# Patient Record
Sex: Female | Born: 1995 | Race: White | Hispanic: No | Marital: Single | Smoking: Current every day smoker
Health system: Southern US, Community
[De-identification: ages and names within clinical notes are randomized; demographics above are authoritative.]

## PROBLEM LIST (undated history)

## (undated) DIAGNOSIS — F988 Other specified behavioral and emotional disorders with onset usually occurring in childhood and adolescence: Secondary | ICD-10-CM

## (undated) DIAGNOSIS — R109 Unspecified abdominal pain: Secondary | ICD-10-CM

## (undated) DIAGNOSIS — A3790 Whooping cough, unspecified species without pneumonia: Secondary | ICD-10-CM

## (undated) DIAGNOSIS — G8929 Other chronic pain: Secondary | ICD-10-CM

## (undated) DIAGNOSIS — K760 Fatty (change of) liver, not elsewhere classified: Secondary | ICD-10-CM

---
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
## 2017-08-06 DIAGNOSIS — K588 Other irritable bowel syndrome: Secondary | ICD-10-CM | POA: Insufficient documentation

## 2017-08-06 DIAGNOSIS — F419 Anxiety disorder, unspecified: Secondary | ICD-10-CM | POA: Insufficient documentation

## 2017-08-06 DIAGNOSIS — F3340 Major depressive disorder, recurrent, in remission, unspecified: Secondary | ICD-10-CM | POA: Insufficient documentation

## 2017-09-20 ENCOUNTER — Encounter (HOSPITAL_COMMUNITY): Payer: Self-pay | Admitting: *Deleted

## 2017-09-20 DIAGNOSIS — F17290 Nicotine dependence, other tobacco product, uncomplicated: Secondary | ICD-10-CM | POA: Diagnosis not present

## 2017-09-20 DIAGNOSIS — B9689 Other specified bacterial agents as the cause of diseases classified elsewhere: Secondary | ICD-10-CM | POA: Diagnosis not present

## 2017-09-20 DIAGNOSIS — N760 Acute vaginitis: Secondary | ICD-10-CM | POA: Diagnosis not present

## 2017-09-20 DIAGNOSIS — R1030 Lower abdominal pain, unspecified: Secondary | ICD-10-CM | POA: Diagnosis present

## 2017-09-20 LAB — COMPREHENSIVE METABOLIC PANEL
ALBUMIN: 4.2 g/dL (ref 3.5–5.0)
ALT: 19 U/L (ref 14–54)
AST: 19 U/L (ref 15–41)
Alkaline Phosphatase: 92 U/L (ref 38–126)
Anion gap: 11 (ref 5–15)
BILIRUBIN TOTAL: 0.8 mg/dL (ref 0.3–1.2)
BUN: 10 mg/dL (ref 6–20)
CO2: 21 mmol/L — AB (ref 22–32)
Calcium: 9.2 mg/dL (ref 8.9–10.3)
Chloride: 105 mmol/L (ref 101–111)
Creatinine, Ser: 0.83 mg/dL (ref 0.44–1.00)
GFR calc Af Amer: >60 mL/min (ref >60)
GFR calc non Af Amer: >60 mL/min (ref >60)
GLUCOSE: 100 mg/dL — AB (ref 65–99)
POTASSIUM: 3.8 mmol/L (ref 3.5–5.1)
SODIUM: 137 mmol/L (ref 135–145)
TOTAL PROTEIN: 7.2 g/dL (ref 6.5–8.1)

## 2017-09-20 LAB — URINALYSIS, ROUTINE W REFLEX MICROSCOPIC
BACTERIA UA: NONE SEEN
Bilirubin Urine: NEGATIVE
Glucose, UA: NEGATIVE mg/dL
Hgb urine dipstick: NEGATIVE
Ketones, ur: NEGATIVE mg/dL
Nitrite: NEGATIVE
PROTEIN: NEGATIVE mg/dL
SPECIFIC GRAVITY, URINE: 1.023 (ref 1.005–1.030)
pH: 7 (ref 5.0–8.0)

## 2017-09-20 LAB — CBC
HEMATOCRIT: 39.5 % (ref 36.0–46.0)
Hemoglobin: 13.3 g/dL (ref 12.0–15.0)
MCH: 28.6 pg (ref 26.0–34.0)
MCHC: 33.7 g/dL (ref 30.0–36.0)
MCV: 84.9 fL (ref 78.0–100.0)
Platelets: 254 10*3/uL (ref 150–400)
RBC: 4.65 MIL/uL (ref 3.87–5.11)
RDW: 13.9 % (ref 11.5–15.5)
WBC: 7.9 10*3/uL (ref 4.0–10.5)

## 2017-09-20 LAB — LIPASE, BLOOD: Lipase: 23 U/L (ref 11–51)

## 2017-09-20 NOTE — ED Triage Notes (Signed)
The pt is c/o abd pain since march  She saw her doctor Tuesday and had a ultra sound .  She is scheduled for a gb scan on Tuesday.  Her pain has increased nausea no vomiting  lmp sept 9th

## 2017-09-21 ENCOUNTER — Emergency Department (HOSPITAL_COMMUNITY)
Admission: EM | Admit: 2017-09-21 | Discharge: 2017-09-21 | Disposition: A | Discharge status: Home / Self Care | Payer: Medicaid Other | Attending: Emergency Medicine | Admitting: Emergency Medicine

## 2017-09-21 DIAGNOSIS — N760 Acute vaginitis: Secondary | ICD-10-CM

## 2017-09-21 DIAGNOSIS — B9689 Other specified bacterial agents as the cause of diseases classified elsewhere: Secondary | ICD-10-CM

## 2017-09-21 HISTORY — DX: Fatty (change of) liver, not elsewhere classified: K76.0

## 2017-09-21 LAB — WET PREP, GENITAL
SPERM: NONE SEEN
TRICH WET PREP: NONE SEEN
YEAST WET PREP: NONE SEEN

## 2017-09-21 LAB — GC/CHLAMYDIA PROBE AMP (~~LOC~~) NOT AT ARMC
Chlamydia: NEGATIVE
Neisseria Gonorrhea: NEGATIVE

## 2017-09-21 LAB — POC URINE PREG, ED: Preg Test, Ur: NEGATIVE

## 2017-09-21 MED ORDER — METRONIDAZOLE 500 MG PO TABS
500.0000 mg | ORAL_TABLET | Freq: Once | ORAL | Status: AC
  Administered 2017-09-21: 500 mg via ORAL
  Filled 2017-09-21: qty 1

## 2017-09-21 MED ORDER — METRONIDAZOLE 500 MG PO TABS: 500.0000 mg | ORAL_TABLET | Freq: Two times a day (BID) | ORAL | 0 refills | Status: DC

## 2017-09-21 MED ORDER — GI COCKTAIL ~~LOC~~
30.0000 mL | Freq: Once | ORAL | Status: AC
  Administered 2017-09-21: 30 mL via ORAL
  Filled 2017-09-21: qty 30

## 2017-09-21 NOTE — ED Notes (Signed)
Signature pad not working, pt verbalized understanding of discharge instructions and prescriptions. 

## 2017-09-21 NOTE — ED Notes (Signed)
Patient ambulatory to bed at this time.  No distress noted

## 2017-09-21 NOTE — ED Provider Notes (Signed)
[facility] EMERGENCY DEPARTMENT Provider Note   CSN: [id number] Arrival date & time: 09/20/17  2005     History   Chief Complaint Chief Complaint  Patient presents with  . Abdominal Pain    HPI Jacqueline Beard is a 21 y.o. female.  The history is provided by the patient.  Abdominal Pain   This is a chronic problem. The current episode started more than 1 week ago. The problem occurs constantly. The problem has not changed since onset.The pain is associated with an unknown factor. The pain is located in the suprapubic region. The pain is moderate. Associated symptoms include nausea. Pertinent negatives include constipation. Nothing aggravates the symptoms. Nothing relieves the symptoms. Past workup does not include GI consult. Her past medical history does not include PUD.    Past Medical History:  Diagnosis Date  . Attention deficit disorder (ADD)   . Liver fatty degeneration   . Pertussis     There are no active problems to display for this patient.   History reviewed. No pertinent surgical history.  OB History    No data available       Home Medications    Prior to Admission medications   Medication Sig Start Date End Date Taking? Authorizing Provider  Soft Lens Products (REWETTING DROPS) SOLN by Does not apply route as directed.   Yes [provider]    Family History No family history on file.  Social History Social History  Substance Use Topics  . Smoking status: Former Games developer  . Smokeless tobacco: Current User  . Alcohol use Not on file     Allergies   Patient has no known allergies.   Review of Systems Review of Systems  Gastrointestinal: Positive for abdominal pain and nausea. Negative for constipation.  All other systems reviewed and are negative.    Physical Exam Updated Vital Signs BP 125/83 (BP Location: Right Arm)   Pulse 94   Temp 99.2 F (37.3 C) (Oral)   Resp 16   Ht 5\' 5"  (1.651 m)   Wt 104.3 kg  (230 lb)   LMP 08/21/2017   SpO2 99%   BMI 38.27 kg/m   Physical Exam  Constitutional: She is oriented to person, place, and time. She appears well-developed and well-nourished. No distress.  HENT:  Head: Normocephalic and atraumatic.  Mouth/Throat: No oropharyngeal exudate.  Eyes: Pupils are equal, round, and reactive to light. Conjunctivae are normal.  Neck: Normal range of motion. Neck supple.  Cardiovascular: Normal rate, regular rhythm, normal heart sounds and intact distal pulses.   Pulmonary/Chest: Effort normal and breath sounds normal. She has no wheezes.  Abdominal: Soft. Bowel sounds are normal. She exhibits no mass. There is no tenderness. There is no rebound and no guarding.  Genitourinary:  Genitourinary Comments: Scant white discharge no CMT or adnexal tenderness   Musculoskeletal: Normal range of motion.  Neurological: She is alert and oriented to person, place, and time.  Skin: Skin is warm and dry. Capillary refill takes less than 2 seconds.  Psychiatric: She has a normal mood and affect.     ED Treatments / Results   Vitals:   09/21/17 0116 09/21/17 0550  BP: 125/83 (!) 118/92  Pulse: 94 77  Resp: 16 16  Temp:    SpO2: 99% 100%    Labs (all labs ordered are listed, but only abnormal results are displayed)  Results for orders placed or performed during the hospital encounter of 09/21/17  Wet prep,  genital  Result Value Ref Range   Yeast Wet Prep HPF POC NONE SEEN NONE SEEN   Trich, Wet Prep NONE SEEN NONE SEEN   Clue Cells Wet Prep HPF POC PRESENT (A) NONE SEEN   WBC, Wet Prep HPF POC MANY (A) NONE SEEN   Sperm NONE SEEN   Lipase, blood  Result Value Ref Range   Lipase 23 11 - 51 U/L  Comprehensive metabolic panel  Result Value Ref Range   Sodium 137 135 - 145 mmol/L   Potassium 3.8 3.5 - 5.1 mmol/L   Chloride 105 101 - 111 mmol/L   CO2 21 (L) 22 - 32 mmol/L   Glucose, Bld 100 (H) 65 - 99 mg/dL   BUN 10 6 - 20 mg/dL   Creatinine, Ser 1.47  0.44 - 1.00 mg/dL   Calcium 9.2 8.9 - 82.9 mg/dL   Total Protein 7.2 6.5 - 8.1 g/dL   Albumin 4.2 3.5 - 5.0 g/dL   AST 19 15 - 41 U/L   ALT 19 14 - 54 U/L   Alkaline Phosphatase 92 38 - 126 U/L   Total Bilirubin 0.8 0.3 - 1.2 mg/dL   GFR calc non Af Amer >60 >60 mL/min   GFR calc Af Amer >60 >60 mL/min   Anion gap 11 5 - 15  CBC  Result Value Ref Range   WBC 7.9 4.0 - 10.5 K/uL   RBC 4.65 3.87 - 5.11 MIL/uL   Hemoglobin 13.3 12.0 - 15.0 g/dL   HCT 56.2 13.0 - 86.5 %   MCV 84.9 78.0 - 100.0 fL   MCH 28.6 26.0 - 34.0 pg   MCHC 33.7 30.0 - 36.0 g/dL   RDW 78.4 69.6 - 29.5 %   Platelets 254 150 - 400 K/uL  Urinalysis, Routine w reflex microscopic  Result Value Ref Range   Color, Urine YELLOW YELLOW   APPearance CLEAR CLEAR   Specific Gravity, Urine 1.023 1.005 - 1.030   pH 7.0 5.0 - 8.0   Glucose, UA NEGATIVE NEGATIVE mg/dL   Hgb urine dipstick NEGATIVE NEGATIVE   Bilirubin Urine NEGATIVE NEGATIVE   Ketones, ur NEGATIVE NEGATIVE mg/dL   Protein, ur NEGATIVE NEGATIVE mg/dL   Nitrite NEGATIVE NEGATIVE   Leukocytes, UA TRACE (A) NEGATIVE   RBC / HPF 0-5 0 - 5 RBC/hpf   WBC, UA 0-5 0 - 5 WBC/hpf   Bacteria, UA NONE SEEN NONE SEEN   Squamous Epithelial / LPF 0-5 (A) NONE SEEN   Mucus PRESENT   POC urine preg, ED  Result Value Ref Range   Preg Test, Ur NEGATIVE NEGATIVE   No results found.   Procedures Procedures (including critical care time)  Medications Ordered in ED Medications  gi cocktail (Maalox,Lidocaine,Donnatal) (not administered)     Final Clinical Impressions(s) / ED Diagnoses   Strict return precautions given.  Strict return precautions given for  Shortness of breath, swelling or the lips or tongue, chest pain, dyspnea on exertion, new weakness or numbness changes in vision or speech,  Inability to tolerate liquids or food, changes in voice cough, altered mental status or any concerns. No signs of systemic illness or infection. The patient is  nontoxic-appearing on exam and vital signs are within normal limits.    I have reviewed the triage vital signs and the nursing notes. Pertinent labs &imaging results that were available during my care of the patient were reviewed by me and considered in my medical decision making (see chart for details).  After history, exam, and medical workup I feel the patient has been appropriately medically screened and is safe for discharge home. Pertinent diagnoses were discussed with the patient. Patient was given return precautions.    Eddie Payette, MD 09/21/17 [phone]

## 2017-09-26 ENCOUNTER — Encounter (HOSPITAL_BASED_OUTPATIENT_CLINIC_OR_DEPARTMENT_OTHER): Payer: Self-pay | Admitting: Emergency Medicine

## 2017-09-26 ENCOUNTER — Emergency Department (HOSPITAL_BASED_OUTPATIENT_CLINIC_OR_DEPARTMENT_OTHER)
Admission: EM | Admit: 2017-09-26 | Discharge: 2017-09-26 | Disposition: A | Discharge status: Home / Self Care | Payer: Medicaid Other | Attending: Emergency Medicine | Admitting: Emergency Medicine

## 2017-09-26 ENCOUNTER — Emergency Department (HOSPITAL_BASED_OUTPATIENT_CLINIC_OR_DEPARTMENT_OTHER): Payer: Medicaid Other

## 2017-09-26 DIAGNOSIS — Y998 Other external cause status: Secondary | ICD-10-CM | POA: Diagnosis not present

## 2017-09-26 DIAGNOSIS — Z87891 Personal history of nicotine dependence: Secondary | ICD-10-CM | POA: Diagnosis not present

## 2017-09-26 DIAGNOSIS — Y92480 Sidewalk as the place of occurrence of the external cause: Secondary | ICD-10-CM | POA: Diagnosis not present

## 2017-09-26 DIAGNOSIS — S93402A Sprain of unspecified ligament of left ankle, initial encounter: Secondary | ICD-10-CM | POA: Diagnosis not present

## 2017-09-26 DIAGNOSIS — Y9301 Activity, walking, marching and hiking: Secondary | ICD-10-CM | POA: Diagnosis not present

## 2017-09-26 DIAGNOSIS — Z23 Encounter for immunization: Secondary | ICD-10-CM | POA: Diagnosis not present

## 2017-09-26 DIAGNOSIS — W19XXXA Unspecified fall, initial encounter: Secondary | ICD-10-CM

## 2017-09-26 DIAGNOSIS — W010XXA Fall on same level from slipping, tripping and stumbling without subsequent striking against object, initial encounter: Secondary | ICD-10-CM | POA: Insufficient documentation

## 2017-09-26 DIAGNOSIS — S99912A Unspecified injury of left ankle, initial encounter: Secondary | ICD-10-CM | POA: Diagnosis present

## 2017-09-26 HISTORY — DX: Unspecified abdominal pain: R10.9

## 2017-09-26 HISTORY — DX: Other chronic pain: G89.29

## 2017-09-26 MED ORDER — NAPROXEN 250 MG PO TABS: 250.0000 mg | ORAL_TABLET | Freq: Two times a day (BID) | ORAL | 0 refills | Status: DC

## 2017-09-26 MED ORDER — IBUPROFEN 800 MG PO TABS
800.0000 mg | ORAL_TABLET | Freq: Once | ORAL | Status: AC
  Administered 2017-09-26: 800 mg via ORAL
  Filled 2017-09-26: qty 1

## 2017-09-26 MED ORDER — TETANUS-DIPHTH-ACELL PERTUSSIS 5-2.5-18.5 LF-MCG/0.5 IM SUSP
0.5000 mL | Freq: Once | INTRAMUSCULAR | Status: AC
  Administered 2017-09-26: 0.5 mL via INTRAMUSCULAR
  Filled 2017-09-26: qty 0.5

## 2017-09-26 NOTE — ED Provider Notes (Signed)
[facility] EMERGENCY DEPARTMENT Provider Note   CSN: [id number] Arrival date & time: 09/26/17  2107     History   Chief Complaint Chief Complaint  Patient presents with  . Fall    HPI Jacqueline Beard is a 21 y.o. female.  Jacqueline Beard is a 21 y.o. Female who presents to the emergency department complaining of left ankle pain after a trip and fall prior to arrival.  Patient reports she was walking when she did not see an elevated curb and tripped causing her to twist her left ankle.  She also reports a superficial abrasion to her right knee. She denies other injury. She denies hitting her head or LOC. She reports pain worse to her left lateral ankle. She has taken nothing for treatment of her symptoms today.  She denies fevers, numbness, tingling, weakness, knee pain.  She is unsure when her last tetanus shot was.   The history is provided by the patient and medical records. No language interpreter was used.  Fall     Past Medical History:  Diagnosis Date  . Attention deficit disorder (ADD)   . Chronic abdominal pain   . Liver fatty degeneration   . Pertussis     There are no active problems to display for this patient.   History reviewed. No pertinent surgical history.  OB History    No data available       Home Medications    Prior to Admission medications   Medication Sig Start Date End Date Taking? Authorizing Provider  metroNIDAZOLE (FLAGYL) 500 MG tablet Take 1 tablet (500 mg total) by mouth 2 (two) times daily. One po bid x 7 days 09/21/17   Palumbo, April, MD  naproxen (NAPROSYN) 250 MG tablet Take 1 tablet (250 mg total) by mouth 2 (two) times daily with a meal. 09/26/17   Everlene Farrieransie, Zaidee Rion, PA-C  Soft Lens Products (REWETTING DROPS) SOLN by Does not apply route as directed.    [provider]    Family History History reviewed. No pertinent family history.  Social History Social History  Substance Use Topics  . Smoking status: Former  Smoker    Types: E-cigarettes  . Smokeless tobacco: Current User  . Alcohol use No     Allergies   Patient has no known allergies.   Review of Systems Review of Systems  Constitutional: Negative for fever.  Musculoskeletal: Positive for arthralgias and joint swelling.  Skin: Positive for wound. Negative for color change.  Neurological: Negative for weakness and numbness.     Physical Exam Updated Vital Signs BP (!) 140/96 (BP Location: Right Arm)   Pulse (!) 104   Temp 98.1 F (36.7 C) (Oral)   Resp 18   Ht 5\' 5"  (1.651 m)   Wt 104.3 kg (230 lb)   LMP 08/08/2017   SpO2 100%   BMI 38.27 kg/m   Physical Exam  Constitutional: She appears well-developed and well-nourished. No distress.  HENT:  Head: Normocephalic and atraumatic.  Eyes: Right eye exhibits no discharge. Left eye exhibits no discharge.  Cardiovascular: Normal rate, regular rhythm and intact distal pulses.   Bilateral dorsalis pedis and posterior tibialis pulses are intact.  Pulmonary/Chest: Effort normal. No respiratory distress.  Musculoskeletal: She exhibits edema and tenderness. She exhibits no deformity.  Tenderness and mild edema noted to the lateral aspect of her left ankle.  No deformity noted.  No ankle instability noted.  No tenderness noted to her left knee or foot.  Superficial  abrasion noted to her right knee.  Neurological: She is alert. No sensory deficit. Coordination normal.  Skin: Skin is warm and dry. Capillary refill takes less than 2 seconds. No rash noted. She is not diaphoretic. No erythema. No pallor.  Psychiatric: She has a normal mood and affect. Her behavior is normal.  Nursing note and vitals reviewed.    ED Treatments / Results  Labs (all labs ordered are listed, but only abnormal results are displayed) Labs Reviewed - No data to display  EKG  EKG Interpretation None       Radiology Dg Ankle Complete Left  Result Date: 09/26/2017 CLINICAL DATA:  Left ankle pain  laterally after twisting injury tonight. EXAM: LEFT ANKLE COMPLETE - 3+ VIEW COMPARISON:  12/28/2010 FINDINGS: Mild soft tissue swelling overlying the lateral malleolus without visible underlying fracture. Plafond and talar dome intact. No malalignment at the Lisfranc joint. No definite tibiotalar joint effusion or well-defined injury of the anterior process of the calcaneus. IMPRESSION: 1. Soft tissue swelling overlying the lateral malleolus but without underlying acute bony abnormality. Electronically Signed   By: Gaylyn Rong M.D.   On: 09/26/2017 21:57    Procedures Procedures (including critical care time)  Medications Ordered in ED Medications  ibuprofen (ADVIL,MOTRIN) tablet 800 mg (800 mg Oral Given 09/26/17 2143)  Tdap (BOOSTRIX) injection 0.5 mL (0.5 mLs Intramuscular Given 09/26/17 2144)     Initial Impression / Assessment and Plan / ED Course  I have reviewed the triage vital signs and the nursing notes.  Pertinent labs & imaging results that were available during my care of the patient were reviewed by me and considered in my medical decision making (see chart for details).    This is a 21 y.o. Female who presents to the emergency department complaining of left ankle pain after a trip and fall prior to arrival.  Patient reports she was walking when she did not see an elevated curb and tripped causing her to twist her left ankle. She is unsure when her last tetanus shot was. On exam the patient is afebrile nontoxic-appearing.  She has mild tenderness and edema noted to the lateral aspect of her left ankle.  No ankle laxity noted.  She is neurovascularly intact.  Ray shows soft tissue swelling overlying the lateral malleolus, but without underlying acute bony abnormality.  Suspect ankle sprain.  Will provide patient with an ASO ankle brace and crutches and have her non-weight-bear until her pain is improving.  I advised if her pain persists she should follow-up with sports  medicine physician Dr. Pearletha Forge.  Return precautions discussed.  Naproxen, ice and elevation for pain control. I advised the patient to follow-up with their primary care provider this week. I advised the patient to return to the emergency department with new or worsening symptoms or new concerns. The patient verbalized understanding and agreement with plan.     Final Clinical Impressions(s) / ED Diagnoses   Final diagnoses:  Sprain of left ankle, unspecified ligament, initial encounter  Fall, initial encounter  Need for Tdap vaccination    New Prescriptions New Prescriptions   NAPROXEN (NAPROSYN) 250 MG TABLET    Take 1 tablet (250 mg total) by mouth 2 (two) times daily with a meal.     Everlene Farrier, PA-C 09/26/17 2221    Pricilla Loveless, MD 09/26/17 2314

## 2017-09-26 NOTE — ED Triage Notes (Signed)
Patient states that she fell earlier today and hurt her left ankle. Swelling noted to her left ankle

## 2017-10-04 ENCOUNTER — Ambulatory Visit (INDEPENDENT_AMBULATORY_CARE_PROVIDER_SITE_OTHER): Payer: Medicaid Other | Admitting: Family Medicine

## 2017-10-04 ENCOUNTER — Encounter: Payer: Self-pay | Admitting: Family Medicine

## 2017-10-04 DIAGNOSIS — S99912A Unspecified injury of left ankle, initial encounter: Secondary | ICD-10-CM | POA: Diagnosis not present

## 2017-10-04 NOTE — Patient Instructions (Addendum)
You have an ankle sprain. Ice the area for 15 minutes at a time, 3-4 times a day Aleve 2 tabs twice a day with food OR ibuprofen 3 tabs three times a day with food for pain and inflammation as needed. Elevate above the level of your heart when possible Use boot when up and walking around to help with stability while you recover from this injury. Come out of the boot twice a day to do Up/down and alphabet exercises 2-3 sets of each. Start theraband strengthening exercises when directed - once a day 3 sets of 10 - in about 1 week. Consider physical therapy for strengthening and balance exercises. If not improving as expected, we may repeat x-rays or consider further testing like an MRI. Follow up in 2 weeks for reevaluation.

## 2017-10-05 ENCOUNTER — Encounter: Payer: Self-pay | Admitting: Family Medicine

## 2017-10-05 DIAGNOSIS — F909 Attention-deficit hyperactivity disorder, unspecified type: Secondary | ICD-10-CM | POA: Insufficient documentation

## 2017-10-05 DIAGNOSIS — S99912D Unspecified injury of left ankle, subsequent encounter: Secondary | ICD-10-CM | POA: Insufficient documentation

## 2017-10-05 NOTE — Assessment & Plan Note (Signed)
independently reviewed radiographs and no evidence fracture.  Consistent with severe lateral ankle sprain.  Switch to cam walker due to her instability.  Motion exercises reviewed.  Aleve or ibuprofen.  Elevation.  Shown theraband strengthening exercises.  F/u in 2 weeks for reevaluation.

## 2017-10-05 NOTE — Progress Notes (Signed)
PCP: System, Pcp Not In  Subjective:   HPI: Patient is a 21 y.o. female here for left ankle injury.  Patient reports on 10/31 she tripped and inverted her left ankle. She was unable to bear weight following this. Pain level 5-6/10 and sharp still, lateral. Wearing ASO but feels uncomfortable and feels unstable in this. No prior left ankle injuries. Not taking any medicines for this but is icing. Bruising and swelling but no numbness, no other skin changes.  Past Medical History:  Diagnosis Date  . Attention deficit disorder (ADD)   . Chronic abdominal pain   . Liver fatty degeneration   . Pertussis     Current Outpatient Medications on File Prior to Visit  Medication Sig Dispense Refill  . norethindrone-ethinyl estradiol (JUNEL FE,GILDESS FE,LOESTRIN FE) 1-20 MG-MCG tablet Take by mouth.    . naproxen (NAPROSYN) 250 MG tablet Take 1 tablet (250 mg total) by mouth 2 (two) times daily with a meal. 30 tablet 0  . Soft Lens Products (REWETTING DROPS) SOLN by Does not apply route as directed.     No current facility-administered medications on file prior to visit.     History reviewed. No pertinent surgical history.  No Known Allergies  Social History   Socioeconomic History  . Marital status: Single    Spouse name: Not on file  . Number of children: Not on file  . Years of education: Not on file  . Highest education level: Not on file  Social Needs  . Financial resource strain: Not on file  . Food insecurity - worry: Not on file  . Food insecurity - inability: Not on file  . Transportation needs - medical: Not on file  . Transportation needs - non-medical: Not on file  Occupational History  . Not on file  Tobacco Use  . Smoking status: Former Smoker    Types: E-cigarettes  . Smokeless tobacco: Current User  Substance and Sexual Activity  . Alcohol use: No  . Drug use: No  . Sexual activity: Not on file  Other Topics Concern  . Not on file  Social History  Narrative  . Not on file    History reviewed. No pertinent family history.  BP 118/80   Pulse 82   Ht 5\' 5"  (1.651 m)   Wt 230 lb (104.3 kg)   BMI 38.27 kg/m   Review of Systems: See HPI above.     Objective:  Physical Exam:  Gen: NAD, comfortable in exam room  Left ankle: Mod lateral swelling, mild bruising.  No other deformity. Mod limitation motion all directions but able to do so with 5/5 strength. TTP over ATFL.  No other tenderness including malleoli, base 5th, navicular, fibular head. 2+ ant drawer and talar tilt.   Negative syndesmotic compression. Thompsons test negative. NV intact distally.  Right ankle: No gross deformity, swelling, ecchymoses FROM with full strength. No TTP Negative ant drawer and talar tilt.   Negative syndesmotic compression. Thompsons test negative. NV intact distally.   Assessment & Plan:  1. Left ankle injury - independently reviewed radiographs and no evidence fracture.  Consistent with severe lateral ankle sprain.  Switch to cam walker due to her instability.  Motion exercises reviewed.  Aleve or ibuprofen.  Elevation.  Shown theraband strengthening exercises.  F/u in 2 weeks for reevaluation.

## 2017-10-11 ENCOUNTER — Telehealth: Payer: Self-pay | Admitting: Family Medicine

## 2017-10-11 NOTE — Telephone Encounter (Signed)
This is typical for a severe ankle sprain.  Is she asking to be excused for today or until she sees me back for follow-up?

## 2017-10-11 NOTE — Telephone Encounter (Signed)
Patient calling with concerns of her left ankle. 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   Patient is wanting to know if she can get a note to be excused from work.  Patient has work today at [employer]

## 2017-10-11 NOTE — Telephone Encounter (Signed)
Letter printed to be out until follow-up next week.

## 2017-10-11 NOTE — Telephone Encounter (Signed)
Patient was informed and will pick up letter today

## 2017-10-11 NOTE — Telephone Encounter (Signed)
Patient was asked and she did not specify. She said whatever time was recommended.

## 2017-10-17 ENCOUNTER — Encounter: Payer: Self-pay | Admitting: Family Medicine

## 2017-10-17 ENCOUNTER — Ambulatory Visit (INDEPENDENT_AMBULATORY_CARE_PROVIDER_SITE_OTHER): Payer: Medicaid Other | Admitting: Family Medicine

## 2017-10-17 DIAGNOSIS — S99912D Unspecified injury of left ankle, subsequent encounter: Secondary | ICD-10-CM | POA: Diagnosis not present

## 2017-10-17 NOTE — Patient Instructions (Signed)
You have an ankle sprain. Ice the area for 15 minutes at a time, 3-4 times a day Aleve 2 tabs twice a day with food OR ibuprofen 3 tabs three times a day with food for pain and inflammation as needed. Elevate above the level of your heart when possible Use laceup brace for stability when up and walking around. Start theraband strengthening exercises once a day 3 sets of 10. Do balance exercises as well if tolerated. Consider physical therapy for strengthening and balance exercises - call me if you want to do this. Follow up in 4 weeks for reevaluation.

## 2017-10-18 ENCOUNTER — Encounter: Payer: Self-pay | Admitting: Family Medicine

## 2017-10-18 NOTE — Progress Notes (Signed)
PCP: System, Pcp Not In  Subjective:   HPI: Patient is a 21 y.o. female here for left ankle injury.  11/8: Patient reports on 10/31 she tripped and inverted her left ankle. She was unable to bear weight following this. Pain level 5-6/10 and sharp still, lateral. Wearing ASO but feels uncomfortable and feels unstable in this. No prior left ankle injuries. Not taking any medicines for this but is icing. Bruising and swelling but no numbness, no other skin changes.  11/21: Patient reports she's still dealing with 4/10 level pain lateral left ankle. Associated swelling. Has transitioned from boot to a brace now. Pain worse and sharp by end of work day. Icing sometimes. No other skin changes, numbness.  Past Medical History:  Diagnosis Date  . Attention deficit disorder (ADD)   . Chronic abdominal pain   . Liver fatty degeneration   . Pertussis     Current Outpatient Medications on File Prior to Visit  Medication Sig Dispense Refill  . naproxen (NAPROSYN) 250 MG tablet Take 1 tablet (250 mg total) by mouth 2 (two) times daily with a meal. 30 tablet 0  . norethindrone-ethinyl estradiol (JUNEL FE,GILDESS FE,LOESTRIN FE) 1-20 MG-MCG tablet Take by mouth.    . Soft Lens Products (REWETTING DROPS) SOLN by Does not apply route as directed.     No current facility-administered medications on file prior to visit.     History reviewed. No pertinent surgical history.  No Known Allergies  Social History   Socioeconomic History  . Marital status: Single    Spouse name: Not on file  . Number of children: Not on file  . Years of education: Not on file  . Highest education level: Not on file  Social Needs  . Financial resource strain: Not on file  . Food insecurity - worry: Not on file  . Food insecurity - inability: Not on file  . Transportation needs - medical: Not on file  . Transportation needs - non-medical: Not on file  Occupational History  . Not on file  Tobacco Use   . Smoking status: Former Smoker    Types: E-cigarettes  . Smokeless tobacco: Current User  Substance and Sexual Activity  . Alcohol use: No  . Drug use: No  . Sexual activity: Not on file  Other Topics Concern  . Not on file  Social History Narrative  . Not on file    History reviewed. No pertinent family history.  BP 118/81   Pulse 92   Ht 5\' 5"  (1.651 m)   Wt 230 lb (104.3 kg)   BMI 38.27 kg/m   Review of Systems: See HPI above.     Objective:  Physical Exam:  Gen: NAD, comfortable in exam room.  Left ankle: Mild anterolateral swelling.  No bruising or other deformity. Mild limitation all motions but 5/5 strength. TTP over ATFL.  No other tenderness. 2+ ant drawer and talar tilt.   Negative syndesmotic compression. Thompsons test negative. NV intact distally.  Assessment & Plan:  1. Left ankle injury - 2/2 severe lateral ankle sprain.  Continue ASO.  Add strengthening exercises and balance exercises.  Icing, aleve or ibuprofen.  Elevation.  Consider physical therapy.  F/u in 4 weeks.

## 2017-10-18 NOTE — Assessment & Plan Note (Signed)
2/2 severe lateral ankle sprain.  Continue ASO.  Add strengthening exercises and balance exercises.  Icing, aleve or ibuprofen.  Elevation.  Consider physical therapy.  F/u in 4 weeks.

## 2017-11-14 ENCOUNTER — Encounter: Payer: Self-pay | Admitting: Family Medicine

## 2017-11-14 ENCOUNTER — Ambulatory Visit (INDEPENDENT_AMBULATORY_CARE_PROVIDER_SITE_OTHER): Payer: Medicaid Other | Admitting: Family Medicine

## 2017-11-14 DIAGNOSIS — S99912D Unspecified injury of left ankle, subsequent encounter: Secondary | ICD-10-CM

## 2017-11-14 NOTE — Progress Notes (Signed)
PCP: System, Pcp Not In  Subjective:   HPI: Patient is a 21 y.o. female here for left ankle injury.  11/8: Patient reports on 10/31 she tripped and inverted her left ankle. She was unable to bear weight following this. Pain level 5-6/10 and sharp still, lateral. Wearing ASO but feels uncomfortable and feels unstable in this. No prior left ankle injuries. Not taking any medicines for this but is icing. Bruising and swelling but no numbness, no other skin changes.  11/21: Patient reports she's still dealing with 4/10 level pain lateral left ankle. Associated swelling. Has transitioned from boot to a brace now. Pain worse and sharp by end of work day. Icing sometimes. No other skin changes, numbness.  12/19: Patient reports she continues to struggle with lateral left ankle pain. Currently 2/10 level, throbbing especially with walking and the other night. Staying swollen. Not icing or taking any medicines. Doing home exercises. Hasn't worn brace in a while. No skin changes, numbness.  Past Medical History:  Diagnosis Date  . Attention deficit disorder (ADD)   . Chronic abdominal pain   . Liver fatty degeneration   . Pertussis     Current Outpatient Medications on File Prior to Visit  Medication Sig Dispense Refill  . naproxen (NAPROSYN) 250 MG tablet Take 1 tablet (250 mg total) by mouth 2 (two) times daily with a meal. 30 tablet 0  . norethindrone-ethinyl estradiol (JUNEL FE,GILDESS FE,LOESTRIN FE) 1-20 MG-MCG tablet Take by mouth.    . Soft Lens Products (REWETTING DROPS) SOLN by Does not apply route as directed.     No current facility-administered medications on file prior to visit.     History reviewed. No pertinent surgical history.  No Known Allergies  Social History   Socioeconomic History  . Marital status: Single    Spouse name: Not on file  . Number of children: Not on file  . Years of education: Not on file  . Highest education level: Not on file   Social Needs  . Financial resource strain: Not on file  . Food insecurity - worry: Not on file  . Food insecurity - inability: Not on file  . Transportation needs - medical: Not on file  . Transportation needs - non-medical: Not on file  Occupational History  . Not on file  Tobacco Use  . Smoking status: Former Smoker    Types: E-cigarettes  . Smokeless tobacco: Current User  Substance and Sexual Activity  . Alcohol use: No  . Drug use: No  . Sexual activity: Not on file  Other Topics Concern  . Not on file  Social History Narrative  . Not on file    History reviewed. No pertinent family history.  BP 122/86   Pulse 76   Ht 5\' 5"  (1.651 m)   Wt 230 lb (104.3 kg)   BMI 38.27 kg/m   Review of Systems: See HPI above.     Objective:  Physical Exam:  Gen: NAD, comfortable in exam room.  Left ankle: Mild lateral swelling.  No bruising, other deformity. FROM with 5/5 strength. TTP over ATFL, anterior ankle joint.  No other tenderness. 2+ ant drawer and talar tilt.   Negative syndesmotic compression. Thompsons test negative. NV intact distally.  Assessment & Plan:  1. Left ankle injury - not improving as expected from lateral sprain though still with instability.  Will go ahead with MRI to assess for osteochondral defect of talus.  In meantime, ASO, strengthening exercises.  Icing, ibuprofen  if needed.

## 2017-11-14 NOTE — Patient Instructions (Signed)
We will go ahead with an MRI of your ankle to assess for an osteochondral defect since you're 6 weeks out and not improving as expected. Do strengthening exercises with theraband in meantime. I would also recommend wearing the laceup ankle brace when you're up and walking around since these ligaments are loose. Follow up will depend on the MRI.

## 2017-11-14 NOTE — Assessment & Plan Note (Signed)
not improving as expected from lateral sprain though still with instability.  Will go ahead with MRI to assess for osteochondral defect of talus.  In meantime, ASO, strengthening exercises.  Icing, ibuprofen if needed.

## 2017-12-14 NOTE — Progress Notes (Deleted)
Psychiatric Initial Adult Assessment   Patient Identification: Jacqueline Beard MRN:  [id number] Date of Evaluation:  12/14/2017 Referral Source: Janace LittenKaria Smith, MD Chief Complaint:   Visit Diagnosis: No diagnosis found.  History of Present Illness:   Jacqueline Beard is a 22 y.o. year old female with a history of anxiety, r/o ADD, migraine, prediabetes, fatty liver, hepatomegaly, who is referred for bipolar I disorder.   [facility] Patient was started on Fiorcet for migraine. No detailed information regarding her mood symptoms.    Associated Signs/Symptoms: Depression Symptoms:  {DEPRESSION SYMPTOMS:20000} (Hypo) Manic Symptoms:  {BHH MANIC SYMPTOMS:22872} Anxiety Symptoms:  {BHH ANXIETY SYMPTOMS:22873} Psychotic Symptoms:  {BHH PSYCHOTIC SYMPTOMS:22874} PTSD Symptoms: {BHH PTSD SYMPTOMS:22875}  Past Psychiatric History:  Outpatient:  Psychiatry admission:  Previous suicide attempt:  Past trials of medication:  History of violence:   Previous Psychotropic Medications: {YES/NO:21197}  Substance Abuse History in the last 12 months:  {yes no:314532}  Consequences of Substance Abuse: {BHH CONSEQUENCES OF SUBSTANCE ABUSE:22880}  Past Medical History:  Past Medical History:  Diagnosis Date  . Attention deficit disorder (ADD)   . Chronic abdominal pain   . Liver fatty degeneration   . Pertussis    No past surgical history on file.  Family Psychiatric History: ***  Family History: No family history on file.  Social History:   Social History   Socioeconomic History  . Marital status: Single    Spouse name: Not on file  . Number of children: Not on file  . Years of education: Not on file  . Highest education level: Not on file  Social Needs  . Financial resource strain: Not on file  . Food insecurity - worry: Not on file  . Food insecurity - inability: Not on file  . Transportation needs - medical: Not on file  . Transportation needs - non-medical:  Not on file  Occupational History  . Not on file  Tobacco Use  . Smoking status: Former Smoker    Types: E-cigarettes  . Smokeless tobacco: Current User  Substance and Sexual Activity  . Alcohol use: No  . Drug use: No  . Sexual activity: Not on file  Other Topics Concern  . Not on file  Social History Narrative  . Not on file    Additional Social History: ***  Allergies:  No Known Allergies  Metabolic Disorder Labs: No results found for: HGBA1C, MPG No results found for: PROLACTIN No results found for: CHOL, TRIG, HDL, CHOLHDL, VLDL, LDLCALC   Current Medications: Current Outpatient Medications  Medication Sig Dispense Refill  . naproxen (NAPROSYN) 250 MG tablet Take 1 tablet (250 mg total) by mouth 2 (two) times daily with a meal. 30 tablet 0  . norethindrone-ethinyl estradiol (JUNEL FE,GILDESS FE,LOESTRIN FE) 1-20 MG-MCG tablet Take by mouth.    . Soft Lens Products (REWETTING DROPS) SOLN by Does not apply route as directed.     No current facility-administered medications for this visit.     Neurologic: Headache: No Seizure: No Paresthesias:No  Musculoskeletal: Strength & Muscle Tone: within normal limits Gait & Station: normal Patient leans: N/A  Psychiatric Specialty Exam: ROS  There were no vitals taken for this visit.There is no height or weight on file to calculate BMI.  General Appearance: Fairly Groomed  Eye Contact:  Good  Speech:  Clear and Coherent  Volume:  Normal  Mood:  {BHH MOOD:22306}  Affect:  {Affect (PAA):22687}  Thought Process:  Coherent and Goal Directed  Orientation:  Full (Time, Place, and Person)  Thought Content:  Logical  Suicidal Thoughts:  {ST/HT (PAA):22692}  Homicidal Thoughts:  {ST/HT (PAA):22692}  Memory:  Immediate;   Good Recent;   Good Remote;   Good  Judgement:  {Judgement (PAA):22694}  Insight:  {Insight (PAA):22695}  Psychomotor Activity:  Normal  Concentration:  Concentration: Good and Attention Span: Good   Recall:  Good  Fund of Knowledge:Good  Language: Good  Akathisia:  No  Handed:  Right  AIMS (if indicated):  ***  Assets:  Communication Skills Desire for Improvement  ADL's:  Intact  Cognition: WNL  Sleep:  ***   Assessment  Plan  The patient demonstrates the following risk factors for suicide: Chronic risk factors for suicide include: {Chronic Risk Factors for ZOXWRUE:45409811}. Acute risk factors for suicide include: {Acute Risk Factors for BJYNWGN:56213086}. Protective factors for this patient include: {Protective Factors for Suicide VHQI:69629528}. Considering these factors, the overall suicide risk at this point appears to be {Desc; low/moderate/high:110033}. Patient {ACTION; IS/IS UXL:24401027} appropriate for outpatient follow up.   Treatment Plan Summary: Plan as above   Neysa Hotter, MD 1/18/201910:02 AM

## 2017-12-18 ENCOUNTER — Ambulatory Visit (HOSPITAL_COMMUNITY): Payer: Medicaid Other | Admitting: Psychiatry

## 2018-01-17 ENCOUNTER — Emergency Department (HOSPITAL_BASED_OUTPATIENT_CLINIC_OR_DEPARTMENT_OTHER)
Admission: EM | Admit: 2018-01-17 | Discharge: 2018-01-17 | Disposition: A | Discharge status: Home / Self Care | Payer: Medicaid Other | Attending: Emergency Medicine | Admitting: Emergency Medicine

## 2018-01-17 ENCOUNTER — Encounter (HOSPITAL_BASED_OUTPATIENT_CLINIC_OR_DEPARTMENT_OTHER): Payer: Self-pay | Admitting: *Deleted

## 2018-01-17 ENCOUNTER — Other Ambulatory Visit: Payer: Self-pay

## 2018-01-17 DIAGNOSIS — Z79899 Other long term (current) drug therapy: Secondary | ICD-10-CM | POA: Insufficient documentation

## 2018-01-17 DIAGNOSIS — N898 Other specified noninflammatory disorders of vagina: Secondary | ICD-10-CM | POA: Insufficient documentation

## 2018-01-17 DIAGNOSIS — F17290 Nicotine dependence, other tobacco product, uncomplicated: Secondary | ICD-10-CM | POA: Insufficient documentation

## 2018-01-17 LAB — URINALYSIS, ROUTINE W REFLEX MICROSCOPIC
Bilirubin Urine: NEGATIVE
GLUCOSE, UA: NEGATIVE mg/dL
KETONES UR: NEGATIVE mg/dL
LEUKOCYTES UA: NEGATIVE
NITRITE: NEGATIVE
PROTEIN: NEGATIVE mg/dL
Specific Gravity, Urine: 1.01 (ref 1.005–1.030)
pH: 6 (ref 5.0–8.0)

## 2018-01-17 LAB — WET PREP, GENITAL
Clue Cells Wet Prep HPF POC: NONE SEEN
Sperm: NONE SEEN
TRICH WET PREP: NONE SEEN
Yeast Wet Prep HPF POC: NONE SEEN

## 2018-01-17 LAB — URINALYSIS, MICROSCOPIC (REFLEX)

## 2018-01-17 LAB — PREGNANCY, URINE: Preg Test, Ur: NEGATIVE

## 2018-01-17 NOTE — Progress Notes (Signed)
Patient's SPO2 is between 85% and 87%.  Placed patient on 1 liter nasal cannula.  Patient's SPO2 increased and remained at 96%.

## 2018-01-17 NOTE — ED Triage Notes (Signed)
Vaginal discharge yellow in color with odor for a week.

## 2018-01-17 NOTE — ED Notes (Signed)
Pt. Reports she has had lower abd. Pain with discharge for a week or two.  Pt. reoprts she was last treated for BV in August and didn't finish the medication.  Pt. Believes she may have BV again due to the odor and the type of discharge.

## 2018-01-17 NOTE — ED Provider Notes (Signed)
[facility] EMERGENCY DEPARTMENT Provider Note   CSN: [id number] Arrival date & time: 01/17/18  1605     History   Chief Complaint Chief Complaint  Patient presents with  . Vaginal Discharge    HPI Jacqueline Beard is a 22 y.o. female.  Patient is a 22 year old female who presents with vaginal discharge.  She reports a one-week history of yellow vaginal discharge.  There is no abdominal pain.  No urinary symptoms.  No nausea or vomiting.  She had a history of bacterial vaginosis as well as yeast infections in the past.  She is sexually active with one female partner.  No known history of STDs.      Past Medical History:  Diagnosis Date  . Attention deficit disorder (ADD)   . Chronic abdominal pain   . Liver fatty degeneration   . Pertussis     Patient Active Problem List   Diagnosis Date Noted  . ADHD 10/05/2017  . Left ankle injury, subsequent encounter 10/05/2017  . Anxiety 08/06/2017  . Other irritable bowel syndrome 08/06/2017  . Recurrent major depressive disorder, in remission (HCC) 08/06/2017    History reviewed. No pertinent surgical history.  OB History    No data available       Home Medications    Prior to Admission medications   Medication Sig Start Date End Date Taking? Authorizing Provider  naproxen (NAPROSYN) 250 MG tablet Take 1 tablet (250 mg total) by mouth 2 (two) times daily with a meal. 09/26/17   Everlene Farrier, PA-C  norethindrone-ethinyl estradiol (JUNEL FE,GILDESS FE,LOESTRIN FE) 1-20 MG-MCG tablet Take by mouth. 09/09/17   [provider]  Soft Lens Products (REWETTING DROPS) SOLN by Does not apply route as directed.    [provider]    Family History No family history on file.  Social History Social History   Tobacco Use  . Smoking status: Former Smoker    Types: E-cigarettes  . Smokeless tobacco: Current User  Substance Use Topics  . Alcohol use: No  . Drug use: No     Allergies   Patient  has no known allergies.   Review of Systems Review of Systems  Constitutional: Negative for chills, diaphoresis, fatigue and fever.  HENT: Negative for congestion, rhinorrhea and sneezing.   Eyes: Negative.   Respiratory: Negative for cough, chest tightness and shortness of breath.   Cardiovascular: Negative for chest pain and leg swelling.  Gastrointestinal: Negative for abdominal pain, blood in stool, diarrhea, nausea and vomiting.  Genitourinary: Positive for vaginal bleeding. Negative for difficulty urinating, flank pain, frequency, hematuria, vaginal discharge and vaginal pain.  Musculoskeletal: Negative for arthralgias and back pain.  Skin: Negative for rash.  Neurological: Negative for dizziness, speech difficulty, weakness, numbness and headaches.     Physical Exam Updated Vital Signs BP 137/70   Pulse 84   Temp 98.2 F (36.8 C) (Oral)   Resp 17   Ht 5\' 5"  (1.651 m)   Wt 111.1 kg (245 lb)   SpO2 98%   BMI 40.77 kg/m   Physical Exam  Constitutional: She is oriented to person, place, and time. She appears well-developed and well-nourished.  HENT:  Head: Normocephalic and atraumatic.  Eyes: Pupils are equal, round, and reactive to light.  Neck: Normal range of motion. Neck supple.  Cardiovascular: Normal rate, regular rhythm and normal heart sounds.  Pulmonary/Chest: Effort normal and breath sounds normal. No respiratory distress. She has no wheezes. She has no rales. She exhibits no  tenderness.  Abdominal: Soft. Bowel sounds are normal. There is no tenderness. There is no rebound and no guarding.  Genitourinary: Vaginal discharge found.  Genitourinary Comments: Positive thin white discharge, no cervical motion tenderness or adnexal tenderness  Musculoskeletal: Normal range of motion. She exhibits no edema.  Lymphadenopathy:    She has no cervical adenopathy.  Neurological: She is alert and oriented to person, place, and time.  Skin: Skin is warm and dry. No rash  noted.  Psychiatric: She has a normal mood and affect.     ED Treatments / Results  Labs (all labs ordered are listed, but only abnormal results are displayed) Labs Reviewed  WET PREP, GENITAL - Abnormal; Notable for the following components:      Result Value   WBC, Wet Prep HPF POC FEW (*)    All other components within normal limits  URINALYSIS, ROUTINE W REFLEX MICROSCOPIC - Abnormal; Notable for the following components:   Hgb urine dipstick TRACE (*)    All other components within normal limits  URINALYSIS, MICROSCOPIC (REFLEX) - Abnormal; Notable for the following components:   Bacteria, UA FEW (*)    Squamous Epithelial / LPF 0-5 (*)    All other components within normal limits  PREGNANCY, URINE  RPR  HIV ANTIBODY (ROUTINE TESTING)  GC/CHLAMYDIA PROBE AMP ([place]) NOT AT [facility]    EKG  EKG Interpretation None       Radiology No results found.  Procedures Procedures (including critical care time)  Medications Ordered in ED Medications - No data to display   Initial Impression / Assessment and Plan / ED Course  I have reviewed the triage vital signs and the nursing notes.  Pertinent labs & imaging results that were available during my care of the patient were reviewed by me and considered in my medical decision making (see chart for details).     Patient is a 22 year old female who presents with vaginal discharge.  She has a small amount of discharge on exam.  Her wet prep is negative.  Her pregnancy test is negative.  STD testing is pending.  She was offered prophylactic treatment for STDs but she is currently refusing.  She wants to wait for the results before being treated.  She was referred to the women's outpatient clinic to follow-up if her symptoms continue.  Final Clinical Impressions(s) / ED Diagnoses   Final diagnoses:  Vaginal discharge    ED Discharge Orders    None       Rolan BuccoBelfi, Zari Cly, MD 01/17/18 2231

## 2018-01-19 LAB — RPR: RPR Ser Ql: NONREACTIVE

## 2018-01-19 LAB — HIV ANTIBODY (ROUTINE TESTING W REFLEX): HIV Screen 4th Generation wRfx: NONREACTIVE

## 2018-01-21 LAB — GC/CHLAMYDIA PROBE AMP (~~LOC~~) NOT AT ARMC
Chlamydia: NEGATIVE
Neisseria Gonorrhea: NEGATIVE

## 2018-02-13 ENCOUNTER — Other Ambulatory Visit: Payer: Self-pay

## 2018-02-13 ENCOUNTER — Encounter (HOSPITAL_BASED_OUTPATIENT_CLINIC_OR_DEPARTMENT_OTHER): Payer: Self-pay | Admitting: Emergency Medicine

## 2018-02-13 ENCOUNTER — Emergency Department (HOSPITAL_BASED_OUTPATIENT_CLINIC_OR_DEPARTMENT_OTHER)
Admission: EM | Admit: 2018-02-13 | Discharge: 2018-02-13 | Disposition: A | Discharge status: Home / Self Care | Payer: Medicaid Other | Attending: Emergency Medicine | Admitting: Emergency Medicine

## 2018-02-13 DIAGNOSIS — R51 Headache: Secondary | ICD-10-CM | POA: Insufficient documentation

## 2018-02-13 DIAGNOSIS — Z5321 Procedure and treatment not carried out due to patient leaving prior to being seen by health care provider: Secondary | ICD-10-CM | POA: Insufficient documentation

## 2018-02-13 NOTE — ED Notes (Signed)
Registration came back and told this RN that the patient chose to leave

## 2018-02-13 NOTE — ED Triage Notes (Signed)
Patient states that she has had a headache x 3 days  - patient states that the pain is over her right eye.

## 2018-02-15 NOTE — ED Notes (Signed)
02/15/2018, Follow-up call completed. 

## 2018-05-14 ENCOUNTER — Other Ambulatory Visit: Payer: Self-pay

## 2018-05-14 ENCOUNTER — Emergency Department (HOSPITAL_BASED_OUTPATIENT_CLINIC_OR_DEPARTMENT_OTHER)
Admission: EM | Admit: 2018-05-14 | Discharge: 2018-05-14 | Disposition: A | Discharge status: Home / Self Care | Payer: Medicaid Other | Attending: Emergency Medicine | Admitting: Emergency Medicine

## 2018-05-14 ENCOUNTER — Encounter (HOSPITAL_BASED_OUTPATIENT_CLINIC_OR_DEPARTMENT_OTHER): Payer: Self-pay | Admitting: Emergency Medicine

## 2018-05-14 DIAGNOSIS — F17228 Nicotine dependence, chewing tobacco, with other nicotine-induced disorders: Secondary | ICD-10-CM | POA: Insufficient documentation

## 2018-05-14 DIAGNOSIS — L309 Dermatitis, unspecified: Secondary | ICD-10-CM | POA: Insufficient documentation

## 2018-05-14 DIAGNOSIS — Z79899 Other long term (current) drug therapy: Secondary | ICD-10-CM | POA: Insufficient documentation

## 2018-05-14 NOTE — ED Triage Notes (Signed)
Patient states that she has a rash between her thighs where they rub together

## 2018-05-14 NOTE — ED Provider Notes (Signed)
[facility] EMERGENCY DEPARTMENT Provider Note   CSN: [id number] Arrival date & time: 05/14/18  [id number]     History   Chief Complaint Chief Complaint  Patient presents with  . Rash    HPI Jacqueline Beard is a 22 y.o. female.  HPI Patient is a 22 year old female presents the emergency department with complaints of ongoing irritation of her bilateral proximal medial thighs from skin irritation from her new work [employer].  She states she wears scrubs at work and the crotch of the scrubs does not come all the way up resulting in skin on skin irritation.  She is looking for recommendations as to what can help with her discomfort.  No fevers or chills.  No other complaints.   Past Medical History:  Diagnosis Date  . Attention deficit disorder (ADD)   . Chronic abdominal pain   . Liver fatty degeneration   . Pertussis     Patient Active Problem List   Diagnosis Date Noted  . ADHD 10/05/2017  . Left ankle injury, subsequent encounter 10/05/2017  . Anxiety 08/06/2017  . Other irritable bowel syndrome 08/06/2017  . Recurrent major depressive disorder, in remission (HCC) 08/06/2017    History reviewed. No pertinent surgical history.   OB History   None      Home Medications    Prior to Admission medications   Medication Sig Start Date End Date Taking? Authorizing Provider  naproxen (NAPROSYN) 250 MG tablet Take 1 tablet (250 mg total) by mouth 2 (two) times daily with a meal. 09/26/17   Everlene Farrieransie, William, PA-C  norethindrone-ethinyl estradiol (JUNEL FE,GILDESS FE,LOESTRIN FE) 1-20 MG-MCG tablet Take by mouth. 09/09/17   [provider]  Soft Lens Products (REWETTING DROPS) SOLN by Does not apply route as directed.    [provider]    Family History History reviewed. No pertinent family history.  Social History Social History   Tobacco Use  . Smoking status: Former Smoker    Types: E-cigarettes  . Smokeless tobacco: Current User  Substance Use  Topics  . Alcohol use: No  . Drug use: No     Allergies   Patient has no known allergies.   Review of Systems Review of Systems  All other systems reviewed and are negative.    Physical Exam Updated Vital Signs BP (!) 143/91 (BP Location: Left Arm)   Pulse 91   Temp 98.1 F (36.7 C) (Oral)   Resp 18   Ht 5\' 5"  (1.651 m)   Wt 114.3 kg (252 lb)   SpO2 98%   BMI 41.93 kg/m   Physical Exam  Constitutional: She is oriented to person, place, and time. She appears well-developed and well-nourished.  HENT:  Head: Normocephalic.  Eyes: EOM are normal.  Neck: Normal range of motion.  Pulmonary/Chest: Effort normal.  Abdominal: She exhibits no distension.  Musculoskeletal: Normal range of motion.  Chronic appearing irritation of her bilateral proximal medial thighs without surrounding erythema or warmth  Neurological: She is alert and oriented to person, place, and time.  Psychiatric: She has a normal mood and affect.  Nursing note and vitals reviewed.    ED Treatments / Results  Labs (all labs ordered are listed, but only abnormal results are displayed) Labs Reviewed - No data to display  EKG None  Radiology No results found.  Procedures Procedures (including critical care time)  Medications Ordered in ED Medications - No data to display   Initial Impression / Assessment and Plan / ED  Course  I have reviewed the triage vital signs and the nursing notes.  Pertinent labs & imaging results that were available during my care of the patient were reviewed by me and considered in my medical decision making (see chart for details).     Recommended 1% hydrocortisone cream for what appears to be chronic irritation/dermatitis.  Also recommended lubricants such as petroleum jelly to decrease friction.  Also recommended a change in clothing such as undershorts or a short tightfitting pair of shorts under her scrubs  Final Clinical Impressions(s) / ED Diagnoses    Final diagnoses:  Dermatitis    ED Discharge Orders    None       Azalia Bilis, MD 05/14/18 [phone]

## 2018-05-14 NOTE — Discharge Instructions (Addendum)
Purchase 1% Hydrocortisone cream and apply twice a day as needed

## 2018-08-24 ENCOUNTER — Other Ambulatory Visit: Payer: Self-pay

## 2018-08-24 ENCOUNTER — Emergency Department (HOSPITAL_BASED_OUTPATIENT_CLINIC_OR_DEPARTMENT_OTHER)
Admission: EM | Admit: 2018-08-24 | Discharge: 2018-08-25 | Disposition: A | Discharge status: Home / Self Care | Payer: Medicaid Other | Attending: Emergency Medicine | Admitting: Emergency Medicine

## 2018-08-24 ENCOUNTER — Encounter (HOSPITAL_BASED_OUTPATIENT_CLINIC_OR_DEPARTMENT_OTHER): Payer: Self-pay | Admitting: Adult Health

## 2018-08-24 DIAGNOSIS — N3090 Cystitis, unspecified without hematuria: Secondary | ICD-10-CM

## 2018-08-24 DIAGNOSIS — Z79899 Other long term (current) drug therapy: Secondary | ICD-10-CM | POA: Insufficient documentation

## 2018-08-24 DIAGNOSIS — F17220 Nicotine dependence, chewing tobacco, uncomplicated: Secondary | ICD-10-CM | POA: Insufficient documentation

## 2018-08-24 LAB — URINALYSIS, ROUTINE W REFLEX MICROSCOPIC
Bilirubin Urine: NEGATIVE
GLUCOSE, UA: NEGATIVE mg/dL
KETONES UR: NEGATIVE mg/dL
Nitrite: NEGATIVE
PH: 7 (ref 5.0–8.0)
Protein, ur: 30 mg/dL — AB
Specific Gravity, Urine: 1.01 (ref 1.005–1.030)

## 2018-08-24 LAB — URINALYSIS, MICROSCOPIC (REFLEX): RBC / HPF: >50 RBC/hpf (ref 0–5)

## 2018-08-24 LAB — PREGNANCY, URINE: PREG TEST UR: NEGATIVE

## 2018-08-24 MED ORDER — SULFAMETHOXAZOLE-TRIMETHOPRIM 800-160 MG PO TABS
1.0000 | ORAL_TABLET | Freq: Once | ORAL | Status: AC
  Administered 2018-08-24: 1 via ORAL
  Filled 2018-08-24: qty 1

## 2018-08-24 NOTE — ED Triage Notes (Signed)
Hematuria that began today described as shapr. She states she has the urge to go but has difficulty getting it started and once she does it is in small amounts and bloody.

## 2018-08-25 MED ORDER — PHENAZOPYRIDINE HCL 200 MG PO TABS: 200.0000 mg | ORAL_TABLET | Freq: Three times a day (TID) | ORAL | 0 refills | Status: DC | PRN

## 2018-08-25 MED ORDER — SULFAMETHOXAZOLE-TRIMETHOPRIM 800-160 MG PO TABS: 1.0000 | ORAL_TABLET | Freq: Two times a day (BID) | ORAL | 0 refills | Status: AC

## 2018-08-25 NOTE — ED Provider Notes (Signed)
[facility] EMERGENCY DEPARTMENT Provider Note   CSN: [id number] Arrival date & time: 08/24/18  2044     History   Chief Complaint Chief Complaint  Patient presents with  . Hematuria    HPI Jacqueline Beard is a 22 y.o. female.  The history is provided by the patient.  Hematuria  This is a new problem. The current episode started 6 to 12 hours ago. The problem occurs hourly. The problem has been gradually worsening. Associated symptoms include abdominal pain. Exacerbated by: Urination. Nothing relieves the symptoms.  She reports sharp pain with urination as well as hematuria.  She reports one episode of back pain.  No fevers or vomiting.  No vaginal complaints.  No recent UTIs.  No history of bladder surgery.  Past Medical History:  Diagnosis Date  . Attention deficit disorder (ADD)   . Chronic abdominal pain   . Liver fatty degeneration   . Pertussis     Patient Active Problem List   Diagnosis Date Noted  . ADHD 10/05/2017  . Left ankle injury, subsequent encounter 10/05/2017  . Anxiety 08/06/2017  . Other irritable bowel syndrome 08/06/2017  . Recurrent major depressive disorder, in remission (HCC) 08/06/2017    History reviewed. No pertinent surgical history.   OB History   None      Home Medications    Prior to Admission medications   Medication Sig Start Date End Date Taking? Authorizing Provider  naproxen (NAPROSYN) 250 MG tablet Take 1 tablet (250 mg total) by mouth 2 (two) times daily with a meal. 09/26/17   Everlene Farrier, PA-C  norethindrone-ethinyl estradiol (JUNEL FE,GILDESS FE,LOESTRIN FE) 1-20 MG-MCG tablet Take by mouth. 09/09/17   [provider]  phenazopyridine (PYRIDIUM) 200 MG tablet Take 1 tablet (200 mg total) by mouth 3 (three) times daily as needed for pain. 08/25/18   Zadie Rhine, MD  Soft Lens Products (REWETTING DROPS) SOLN by Does not apply route as directed.    [provider]    sulfamethoxazole-trimethoprim (BACTRIM DS,SEPTRA DS) 800-160 MG tablet Take 1 tablet by mouth 2 (two) times daily for 3 days. 08/25/18 08/28/18  Zadie Rhine, MD    Family History History reviewed. No pertinent family history.  Social History Social History   Tobacco Use  . Smoking status: Former Smoker    Types: E-cigarettes  . Smokeless tobacco: Current User  Substance Use Topics  . Alcohol use: No  . Drug use: No     Allergies   Patient has no known allergies.   Review of Systems Review of Systems  Constitutional: Negative for fever.  Gastrointestinal: Positive for abdominal pain. Negative for vomiting.  Genitourinary: Positive for dysuria and hematuria.  All other systems reviewed and are negative.    Physical Exam Updated Vital Signs BP (!) 130/104 (BP Location: Right Arm)   Pulse 98   Temp 99 F (37.2 C) (Oral)   Resp 18   Ht 1.651 m (5\' 5" )   Wt 113.4 kg   SpO2 100%   BMI 41.60 kg/m   Physical Exam  CONSTITUTIONAL: Well developed/well nourished HEAD: Normocephalic/atraumatic EYES: EOMI/PERRL ENMT: Mucous membranes moist NECK: supple no meningeal signs SPINE/BACK:entire spine nontender CV: S1/S2 noted, no murmurs/rubs/gallops noted LUNGS: Lungs are clear to auscultation bilaterally, no apparent distress ABDOMEN: soft, nontender, no rebound or guarding, bowel sounds noted throughout abdomen GU:no cva tenderness NEURO: Pt is awake/alert/appropriate, moves all extremitiesx4.  No facial droop.   EXTREMITIES: pulses normal/equal, full ROM SKIN: warm, color normal  PSYCH: no abnormalities of mood noted, alert and oriented to situation  ED Treatments / Results  Labs (all labs ordered are listed, but only abnormal results are displayed) Labs Reviewed  URINALYSIS, ROUTINE W REFLEX MICROSCOPIC - Abnormal; Notable for the following components:      Result Value   Color, Urine RED (*)    APPearance CLOUDY (*)    Hgb urine dipstick LARGE (*)     Protein, ur 30 (*)    Leukocytes, UA SMALL (*)    All other components within normal limits  URINALYSIS, MICROSCOPIC (REFLEX) - Abnormal; Notable for the following components:   Bacteria, UA FEW (*)    All other components within normal limits  PREGNANCY, URINE    EKG None  Radiology No results found.  Procedures Procedures (including critical care time)  Medications Ordered in ED Medications  sulfamethoxazole-trimethoprim (BACTRIM DS,SEPTRA DS) 800-160 MG per tablet 1 tablet (1 tablet Oral Given 08/24/18 2355)     Initial Impression / Assessment and Plan / ED Course  I have reviewed the triage vital signs and the nursing notes.  Pertinent labs results that were available during my care of the patient were reviewed by me and considered in my medical decision making (see chart for details).     PT with hemorrhagic cystitis.  No signs of urinary retention.  We will place her on Bactrim. We discussed strict return precautions Final Clinical Impressions(s) / ED Diagnoses   Final diagnoses:  Cystitis    ED Discharge Orders         Ordered    sulfamethoxazole-trimethoprim (BACTRIM DS,SEPTRA DS) 800-160 MG tablet  2 times daily     08/25/18 0019    phenazopyridine (PYRIDIUM) 200 MG tablet  3 times daily PRN     08/25/18 0019           Zadie Rhine, MD 08/25/18 [phone]

## 2018-12-08 ENCOUNTER — Other Ambulatory Visit: Payer: Self-pay

## 2018-12-08 ENCOUNTER — Encounter (HOSPITAL_BASED_OUTPATIENT_CLINIC_OR_DEPARTMENT_OTHER): Payer: Self-pay | Admitting: Adult Health

## 2018-12-08 ENCOUNTER — Emergency Department (HOSPITAL_BASED_OUTPATIENT_CLINIC_OR_DEPARTMENT_OTHER)
Admission: EM | Admit: 2018-12-08 | Discharge: 2018-12-08 | Disposition: A | Discharge status: Home / Self Care | Payer: Medicaid Other | Attending: Emergency Medicine | Admitting: Emergency Medicine

## 2018-12-08 DIAGNOSIS — B9689 Other specified bacterial agents as the cause of diseases classified elsewhere: Secondary | ICD-10-CM

## 2018-12-08 DIAGNOSIS — Z79899 Other long term (current) drug therapy: Secondary | ICD-10-CM | POA: Insufficient documentation

## 2018-12-08 DIAGNOSIS — B373 Candidiasis of vulva and vagina: Secondary | ICD-10-CM

## 2018-12-08 DIAGNOSIS — F17220 Nicotine dependence, chewing tobacco, uncomplicated: Secondary | ICD-10-CM | POA: Insufficient documentation

## 2018-12-08 DIAGNOSIS — N760 Acute vaginitis: Secondary | ICD-10-CM | POA: Insufficient documentation

## 2018-12-08 DIAGNOSIS — B3731 Acute candidiasis of vulva and vagina: Secondary | ICD-10-CM

## 2018-12-08 LAB — PREGNANCY, URINE: Preg Test, Ur: NEGATIVE

## 2018-12-08 LAB — URINALYSIS, ROUTINE W REFLEX MICROSCOPIC
BILIRUBIN URINE: NEGATIVE
GLUCOSE, UA: NEGATIVE mg/dL
KETONES UR: NEGATIVE mg/dL
LEUKOCYTES UA: NEGATIVE
Nitrite: NEGATIVE
PROTEIN: NEGATIVE mg/dL
Specific Gravity, Urine: 1.025 (ref 1.005–1.030)
pH: 6.5 (ref 5.0–8.0)

## 2018-12-08 LAB — WET PREP, GENITAL
Sperm: NONE SEEN
Trich, Wet Prep: NONE SEEN

## 2018-12-08 LAB — URINALYSIS, MICROSCOPIC (REFLEX)

## 2018-12-08 MED ORDER — METRONIDAZOLE 500 MG PO TABS
500.0000 mg | ORAL_TABLET | Freq: Once | ORAL | Status: AC
  Administered 2018-12-08: 500 mg via ORAL
  Filled 2018-12-08: qty 1

## 2018-12-08 MED ORDER — METRONIDAZOLE 500 MG PO TABS: 500.0000 mg | ORAL_TABLET | Freq: Two times a day (BID) | ORAL | 0 refills | Status: DC

## 2018-12-08 MED ORDER — FLUCONAZOLE 50 MG PO TABS
150.0000 mg | ORAL_TABLET | Freq: Once | ORAL | Status: AC
  Administered 2018-12-08: 19:00:00 150 mg via ORAL
  Filled 2018-12-08: qty 1

## 2018-12-08 NOTE — Discharge Instructions (Signed)
Please read the instructions below. Talk with your primary care provider about any new medications. Please schedule an appointment for follow up with your OBGYN or primary care. Starting tomorrow, begin  your antibiotic (Flagyl/Metronidazole) and take as prescribed until gone. Do not drink alcohol with this medication as it will cause vomiting. You will receive a call from the hospital if your test results come back positive.  Avoid sexual activity until you know your test results. If your results come back positive, it is important that you inform all of your sexual partners. Return to the ER for new or worsening symptoms.

## 2018-12-08 NOTE — ED Notes (Signed)
ED Provider at bedside. 

## 2018-12-08 NOTE — ED Triage Notes (Signed)
Presents with 4 days of chills, dysuria, yellow/white vaginal discharge and vaginal itching. She reports that an OTC monitstat yeast medication did not help.

## 2018-12-08 NOTE — ED Provider Notes (Signed)
[facility] EMERGENCY DEPARTMENT Provider Note   CSN: [id number] Arrival date & time: 12/08/18  1558     History   Chief Complaint Chief Complaint  Patient presents with  . Vaginal Discharge    HPI Jacqueline Beard is a 23 y.o. female w PMHx BV, frequent UTI, anxiety, presenting to the ED with complaint of vaginal discomfort and itching x 4days. Initially treated like yeast infection, with monistat without relief. Also endorses yellow/white vaginal discharge without odor. Pt reports intermittent mild dysuria.  Hx of frequent BV and UTI. No recent abx. Denies N/V, fever. No OCPs. LMP 2 weeks ago. Sexually active with female partner without protection.   The history is provided by the patient.    Past Medical History:  Diagnosis Date  . Attention deficit disorder (ADD)   . Chronic abdominal pain   . Liver fatty degeneration   . Pertussis     Patient Active Problem List   Diagnosis Date Noted  . ADHD 10/05/2017  . Left ankle injury, subsequent encounter 10/05/2017  . Anxiety 08/06/2017  . Other irritable bowel syndrome 08/06/2017  . Recurrent major depressive disorder, in remission (HCC) 08/06/2017    History reviewed. No pertinent surgical history.   OB History   No obstetric history on file.      Home Medications    Prior to Admission medications   Medication Sig Start Date End Date Taking? Authorizing Provider  metroNIDAZOLE (FLAGYL) 500 MG tablet Take 1 tablet (500 mg total) by mouth 2 (two) times daily. 12/08/18   Aniah Pauli, [place] N, PA-C  naproxen (NAPROSYN) 250 MG tablet Take 1 tablet (250 mg total) by mouth 2 (two) times daily with a meal. 09/26/17   Everlene Farrier, PA-C  norethindrone-ethinyl estradiol (JUNEL FE,GILDESS FE,LOESTRIN FE) 1-20 MG-MCG tablet Take by mouth. 09/09/17   [provider]  phenazopyridine (PYRIDIUM) 200 MG tablet Take 1 tablet (200 mg total) by mouth 3 (three) times daily as needed for pain. 08/25/18   Zadie Rhine, MD   Soft Lens Products (REWETTING DROPS) SOLN by Does not apply route as directed.    [provider]    Family History History reviewed. No pertinent family history.  Social History Social History   Tobacco Use  . Smoking status: Former Smoker    Types: E-cigarettes  . Smokeless tobacco: Current User  Substance Use Topics  . Alcohol use: No  . Drug use: No     Allergies   Patient has no known allergies.   Review of Systems Review of Systems  Constitutional: Negative for fever.  Gastrointestinal: Negative for abdominal pain, nausea and vomiting.  Genitourinary: Positive for dysuria, vaginal discharge and vaginal pain. Negative for flank pain, frequency and vaginal bleeding.  All other systems reviewed and are negative.    Physical Exam Updated Vital Signs BP 122/70 (BP Location: Right Arm)   Pulse 88   Temp 98.7 F (37.1 C) (Oral)   Resp 18   Ht 5\' 5"  (1.651 m)   Wt 106.1 kg   SpO2 100%   BMI 38.94 kg/m   Physical Exam Vitals signs and nursing note reviewed. Exam conducted with a chaperone present.  Constitutional:      General: She is not in acute distress.    Appearance: She is well-developed. She is obese.  HENT:     Head: Normocephalic and atraumatic.  Eyes:     Conjunctiva/sclera: Conjunctivae normal.  Cardiovascular:     Rate and Rhythm: Normal rate and regular rhythm.  Pulmonary:     Effort: Pulmonary effort is normal.  Abdominal:     General: Bowel sounds are normal. There is no distension.     Palpations: Abdomen is soft.     Tenderness: There is no abdominal tenderness. There is no guarding.  Genitourinary:    Vagina: No tenderness.     Cervix: Discharge present. No cervical motion tenderness.     Uterus: Normal.      Adnexa: Right adnexa normal and left adnexa normal.     Comments: Exam performed with female nurse tech chaperone present.  Small to moderate amount of white/slightly yellow thick cervical discharge present.  vaginal  erythema present. Skin:    General: Skin is warm.  Neurological:     Mental Status: She is alert.  Psychiatric:        Behavior: Behavior normal.      ED Treatments / Results  Labs (all labs ordered are listed, but only abnormal results are displayed) Labs Reviewed  WET PREP, GENITAL - Abnormal; Notable for the following components:      Result Value   Yeast Wet Prep HPF POC PRESENT (*)    Clue Cells Wet Prep HPF POC PRESENT (*)    WBC, Wet Prep HPF POC MODERATE (*)    All other components within normal limits  URINALYSIS, ROUTINE W REFLEX MICROSCOPIC - Abnormal; Notable for the following components:   APPearance CLOUDY (*)    Hgb urine dipstick TRACE (*)    All other components within normal limits  URINALYSIS, MICROSCOPIC (REFLEX) - Abnormal; Notable for the following components:   Bacteria, UA FEW (*)    All other components within normal limits  PREGNANCY, URINE  HIV ANTIBODY (ROUTINE TESTING W REFLEX)  RPR  GC[facility]CHLAMYDIA PROBE AMP [facility]    EKG None  Radiology No results found.  Procedures Procedures (including critical care time)  Medications Ordered in ED Medications  fluconazole (DIFLUCAN) tablet 150 mg (150 mg Oral Given 12/08/18 1918)  metroNIDAZOLE (FLAGYL) tablet 500 mg (500 mg Oral Given 12/08/18 1918)     Initial Impression / Assessment and Plan / ED Course  I have reviewed the triage vital signs and the nursing notes.  Pertinent labs & imaging results that were available during my care of the patient were reviewed by me and considered in my medical decision making (see chart for details).     Pt w vaginal discharge and discomfort, with mild intermittent dysuria.  Risky sexual behavior recently.  No systemic symptoms.  Abdominal exam is benign.  Pelvic exam with discharge present.  No CMT or adnexal tenderness.  Wet prep with yeast and clue cells present, moderate white cells.  UA appears to be contaminated with 6-10 squamous and  few bacteria, normal white cells and no leuks.  STD culture sent.  Urine preg is negative.  Treated in the ED with Diflucan and prescribed Flagyl for BV.  Patient declined prophylactic STD treatment at this time, and would prefer to wait for STD results.  Stressed with patient to avoid intercourse until she knows her results. Discussed importance of using protection when sexually active. Pt understands that they have GC/Chlamydia cultures pending and that they will need to inform all sexual partners if results return positive. Pt not concerning for PID because hemodynamically stable and no cervical motion tenderness on pelvic exam. Pt treated in the ED for yeast infection with Diflucan.  Prescribed Flagyl for bacterial vaginosis. Pt has been advised to not  drink alcohol while on this medication. Pt afebrile and nontoxic, safe for discharge home.  Discussed results, findings, treatment and follow up. Patient advised of return precautions. Patient verbalized understanding and agreed with plan.  Final Clinical Impressions(s) / ED Diagnoses   Final diagnoses:  BV (bacterial vaginosis)  Yeast vaginitis    ED Discharge Orders         Ordered    metroNIDAZOLE (FLAGYL) 500 MG tablet  2 times daily     12/08/18 1927           Sharisa Toves, [place] N, PA-C 12/08/18 1931    Doug Sou, MD 12/09/18 [phone]

## 2018-12-08 NOTE — ED Notes (Addendum)
Pt c/o of vaginal discharge with itching that is not responding to medications-monistat OTC x 4 days. Denies pain at time of assessment.

## 2018-12-10 LAB — GC/CHLAMYDIA PROBE AMP (~~LOC~~) NOT AT ARMC
CHLAMYDIA, DNA PROBE: NEGATIVE
NEISSERIA GONORRHEA: NEGATIVE

## 2018-12-10 LAB — RPR: RPR Ser Ql: NONREACTIVE

## 2018-12-10 LAB — HIV ANTIBODY (ROUTINE TESTING W REFLEX): HIV SCREEN 4TH GENERATION: NONREACTIVE

## 2019-01-23 ENCOUNTER — Other Ambulatory Visit: Payer: Self-pay

## 2019-01-23 ENCOUNTER — Encounter (HOSPITAL_BASED_OUTPATIENT_CLINIC_OR_DEPARTMENT_OTHER): Payer: Self-pay | Admitting: Emergency Medicine

## 2019-01-23 ENCOUNTER — Emergency Department (HOSPITAL_BASED_OUTPATIENT_CLINIC_OR_DEPARTMENT_OTHER)
Admission: EM | Admit: 2019-01-23 | Discharge: 2019-01-24 | Disposition: A | Discharge status: Home / Self Care | Payer: Medicaid Other | Attending: Emergency Medicine | Admitting: Emergency Medicine

## 2019-01-23 DIAGNOSIS — Z79899 Other long term (current) drug therapy: Secondary | ICD-10-CM | POA: Insufficient documentation

## 2019-01-23 DIAGNOSIS — J029 Acute pharyngitis, unspecified: Secondary | ICD-10-CM

## 2019-01-23 DIAGNOSIS — F17210 Nicotine dependence, cigarettes, uncomplicated: Secondary | ICD-10-CM | POA: Insufficient documentation

## 2019-01-23 NOTE — ED Triage Notes (Signed)
Pt states she has a sore throat last night and today has been running a fever  States tonight she started having body aches

## 2019-01-24 LAB — GROUP A STREP BY PCR: Group A Strep by PCR: NOT DETECTED

## 2019-01-24 MED ORDER — ACETAMINOPHEN 325 MG PO TABS
650.0000 mg | ORAL_TABLET | Freq: Once | ORAL | Status: AC
  Administered 2019-01-24: 650 mg via ORAL
  Filled 2019-01-24: qty 2

## 2019-01-24 MED ORDER — DEXAMETHASONE SODIUM PHOSPHATE 10 MG/ML IJ SOLN
10.0000 mg | Freq: Once | INTRAMUSCULAR | Status: AC
  Administered 2019-01-24: 10 mg via INTRAMUSCULAR
  Filled 2019-01-24: qty 1

## 2019-01-24 NOTE — ED Provider Notes (Signed)
[facility] Provider Note: Jacqueline Dell, MD, FACEP  CSN: [id number] MRN: [id number] ARRIVAL: 01/23/19 at 2340 ROOM: MH01/[facility]01   CHIEF COMPLAINT  Fever   HISTORY OF PRESENT ILLNESS  01/24/19 12:07 AM Jacqueline Beard is a 23 y.o. female who complains of fever to 100.3, body aches and sore throat since yesterday afternoon about 3 PM.  She has not taken anything for her symptoms.  Symptoms are moderate.  Sore throat is worse with swallowing.  She denies nasal congestion, nausea, vomiting or diarrhea.   Past Medical History:  Diagnosis Date  . Attention deficit disorder (ADD)   . Chronic abdominal pain   . Liver fatty degeneration   . Pertussis     History reviewed. No pertinent surgical history.  Family History  Problem Relation Age of Onset  . Hypertension Other   . Cancer Other   . Diabetes Other     Social History   Tobacco Use  . Smoking status: Current Some Day Smoker    Types: E-cigarettes, Cigarettes  . Smokeless tobacco: Current User  Substance Use Topics  . Alcohol use: Yes    Comment: social  . Drug use: No    Prior to Admission medications   Medication Sig Start Date End Date Taking? Authorizing Provider  metroNIDAZOLE (FLAGYL) 500 MG tablet Take 1 tablet (500 mg total) by mouth 2 (two) times daily. 12/08/18   Robinson, [place] N, PA-C  naproxen (NAPROSYN) 250 MG tablet Take 1 tablet (250 mg total) by mouth 2 (two) times daily with a meal. 09/26/17   Everlene Farrier, PA-C  norethindrone-ethinyl estradiol (JUNEL FE,GILDESS FE,LOESTRIN FE) 1-20 MG-MCG tablet Take by mouth. 09/09/17   [provider]  phenazopyridine (PYRIDIUM) 200 MG tablet Take 1 tablet (200 mg total) by mouth 3 (three) times daily as needed for pain. 08/25/18   Zadie Rhine, MD  Soft Lens Products (REWETTING DROPS) SOLN by Does not apply route as directed.    [provider]    Allergies Patient has no known allergies.   REVIEW OF SYSTEMS  Negative except  as noted here or in the History of Present Illness.   PHYSICAL EXAMINATION  Initial Vital Signs Blood pressure (!) 132/92, pulse (!) 103, temperature 98.5 F (36.9 C), temperature source Oral, resp. rate 18, height 5\' 5"  (1.651 m), weight 113.4 kg, last menstrual period 11/28/2018, SpO2 100 %.  Examination General: Well-developed, well-nourished female in no acute distress; appearance consistent with age of record HENT: normocephalic; atraumatic; no pharyngeal erythema or exudate Eyes: pupils equal, round and reactive to light; extraocular muscles intact Neck: supple Heart: regular rate and rhythm Lungs: clear to auscultation bilaterally Abdomen: soft; nondistended; nontender; bowel sounds present Extremities: No deformity; full range of motion; pulses normal Neurologic: Awake, alert and oriented; motor function intact in all extremities and symmetric; no facial droop Skin: Warm and dry Psychiatric: Normal mood and affect   RESULTS  Summary of this visit's results, reviewed by myself:   EKG Interpretation  Date/Time:    Ventricular Rate:    PR Interval:    QRS Duration:   QT Interval:    QTC Calculation:   R Axis:     Text Interpretation:        Laboratory Studies: Results for orders placed or performed during the hospital encounter of 01/23/19 (from the past 24 hour(s))  Group A Strep by PCR     Status: None   Collection Time: 01/23/19 11:52 PM  Result Value Ref Range  Group A Strep by PCR NOT DETECTED NOT DETECTED   Imaging Studies: No results found.  ED COURSE and MDM  Nursing notes and initial vitals signs, including pulse oximetry, reviewed.  Vitals:   01/23/19 2344 01/23/19 2347  BP:  (!) 132/92  Pulse:  (!) 103  Resp:  18  Temp:  98.5 F (36.9 C)  TempSrc:  Oral  SpO2:  100%  Weight: 113.4 kg   Height: 5\' 5"  (1.651 m)    Given negative strep test symptoms are consistent with a viral pharyngitis.  PROCEDURES    ED DIAGNOSES     ICD-10-CM     1. Viral pharyngitis J02.9        Jemel Ono, MD 01/24/19 [facility]

## 2019-06-12 ENCOUNTER — Encounter (HOSPITAL_BASED_OUTPATIENT_CLINIC_OR_DEPARTMENT_OTHER): Payer: Self-pay

## 2019-06-12 ENCOUNTER — Emergency Department (HOSPITAL_BASED_OUTPATIENT_CLINIC_OR_DEPARTMENT_OTHER)
Admission: EM | Admit: 2019-06-12 | Discharge: 2019-06-12 | Disposition: A | Discharge status: Home / Self Care | Payer: Medicaid Other | Attending: Emergency Medicine | Admitting: Emergency Medicine

## 2019-06-12 ENCOUNTER — Other Ambulatory Visit: Payer: Self-pay

## 2019-06-12 DIAGNOSIS — Z203 Contact with and (suspected) exposure to rabies: Secondary | ICD-10-CM | POA: Insufficient documentation

## 2019-06-12 DIAGNOSIS — W5501XA Bitten by cat, initial encounter: Secondary | ICD-10-CM | POA: Insufficient documentation

## 2019-06-12 DIAGNOSIS — Y929 Unspecified place or not applicable: Secondary | ICD-10-CM | POA: Insufficient documentation

## 2019-06-12 DIAGNOSIS — Y93K9 Activity, other involving animal care: Secondary | ICD-10-CM | POA: Insufficient documentation

## 2019-06-12 DIAGNOSIS — Y998 Other external cause status: Secondary | ICD-10-CM | POA: Insufficient documentation

## 2019-06-12 DIAGNOSIS — Z23 Encounter for immunization: Secondary | ICD-10-CM | POA: Insufficient documentation

## 2019-06-12 DIAGNOSIS — S51852A Open bite of left forearm, initial encounter: Secondary | ICD-10-CM | POA: Insufficient documentation

## 2019-06-12 DIAGNOSIS — F17210 Nicotine dependence, cigarettes, uncomplicated: Secondary | ICD-10-CM | POA: Insufficient documentation

## 2019-06-12 MED ORDER — RABIES IMMUNE GLOBULIN 150 UNIT/ML IM INJ
20.0000 [IU]/kg | INJECTION | Freq: Once | INTRAMUSCULAR | Status: AC
  Administered 2019-06-12: 19:00:00 2250 [IU] via INTRAMUSCULAR
  Filled 2019-06-12: qty 16

## 2019-06-12 MED ORDER — AMOXICILLIN-POT CLAVULANATE 875-125 MG PO TABS: 1.0000 | ORAL_TABLET | Freq: Two times a day (BID) | ORAL | 0 refills | Status: AC

## 2019-06-12 MED ORDER — RABIES VACCINE, PCEC IM SUSR
1.0000 mL | Freq: Once | INTRAMUSCULAR | Status: AC
  Administered 2019-06-12: 1 mL via INTRAMUSCULAR
  Filled 2019-06-12: qty 1

## 2019-06-12 MED ORDER — IBUPROFEN 400 MG PO TABS
600.0000 mg | ORAL_TABLET | Freq: Once | ORAL | Status: AC
  Administered 2019-06-12: 600 mg via ORAL
  Filled 2019-06-12: qty 1

## 2019-06-12 NOTE — ED Triage Notes (Signed)
Pt reports stray cat bite to left UE x 2 hours PTA-puncture wound and scratch like marks noted-NAD-steady gait

## 2019-06-12 NOTE — Discharge Instructions (Signed)
Please take all of your antibiotics until finished!  You will need to have additional rabies vaccine on July 19th, 23rd and 30th. Please call urgent care to have this done. You can always return to ED if needed as well.   Keep wounds very clean and dry. Wash several times with soap and water.   Monitor for signs of infection such as redness, swelling, fever. Return to ER if this occurs or you have any additional concerns.

## 2019-06-12 NOTE — ED Provider Notes (Signed)
[facility] EMERGENCY DEPARTMENT Provider Note   CSN: [id number] Arrival date & time: 06/12/19  1634    History   Chief Complaint Chief Complaint  Patient presents with  . Animal Bite    HPI Girtha Kilgore is a 23 y.o. female.     The history is provided by the patient and medical records. No language interpreter was used.  Animal Bite Associated symptoms: no fever and no numbness      Acsa Estey is a 23 y.o. female  with a PMH as listed below who presents to the Emergency Department for evaluation of her cat bite about 2 hours ago.  Patient states that she picked up a stray cat which then bit her on the left forearm.  She is unsure where the cat is located now.  She immediately rinsed with water, then later with rubbing alcohol.  Applied pressure and bleeding was controlled with Band-Aid.  No arthralgias or myalgias other than around the laceration site.  No numbness or weakness.  Her tetanus is up-to-date.   Past Medical History:  Diagnosis Date  . Attention deficit disorder (ADD)   . Chronic abdominal pain   . Liver fatty degeneration   . Pertussis     Patient Active Problem List   Diagnosis Date Noted  . ADHD 10/05/2017  . Left ankle injury, subsequent encounter 10/05/2017  . Anxiety 08/06/2017  . Other irritable bowel syndrome 08/06/2017  . Recurrent major depressive disorder, in remission ([place]) 08/06/2017    History reviewed. No pertinent surgical history.   OB History   No obstetric history on file.      Home Medications    Prior to Admission medications   Medication Sig Start Date End Date Taking? Authorizing Provider  amoxicillin-clavulanate (AUGMENTIN) 875-125 MG tablet Take 1 tablet by mouth every 12 (twelve) hours. 06/12/19   Dawnelle Warman, Ozella Almond, PA-C  norethindrone-ethinyl estradiol (JUNEL FE,GILDESS FE,LOESTRIN FE) 1-20 MG-MCG tablet Take by mouth. 09/09/17   [provider]  Soft Lens Products (REWETTING DROPS) SOLN by Does not  apply route as directed.    [provider]    Family History Family History  Problem Relation Age of Onset  . Hypertension Other   . Cancer Other   . Diabetes Other     Social History Social History   Tobacco Use  . Smoking status: Current Every Day Smoker    Types: Cigarettes  . Smokeless tobacco: Never Used  Substance Use Topics  . Alcohol use: Yes    Comment: social  . Drug use: No     Allergies   Patient has no known allergies.   Review of Systems Review of Systems  Constitutional: Negative for fever.  Musculoskeletal: Negative for arthralgias.  Skin: Positive for wound.  Neurological: Negative for weakness and numbness.     Physical Exam Updated Vital Signs BP 136/81 (BP Location: Left Arm)   Pulse 95   Temp 98.7 F (37.1 C) (Oral)   Resp 16   Ht 5\' 5"  (1.651 m)   Wt 111.1 kg   SpO2 99%   BMI 40.77 kg/m   Physical Exam Vitals signs and nursing note reviewed.  Constitutional:      General: She is not in acute distress.    Appearance: She is well-developed.  HENT:     Head: Normocephalic and atraumatic.  Neck:     Musculoskeletal: Neck supple.  Cardiovascular:     Rate and Rhythm: Normal rate and regular rhythm.  Heart sounds: Normal heart sounds. No murmur.  Pulmonary:     Effort: Pulmonary effort is normal. No respiratory distress.     Breath sounds: Normal breath sounds. No wheezing or rales.  Musculoskeletal: Normal range of motion.     Comments: Left forearm non-tender. Full ROM and 5/5 strength. 2+ radial pulse. Sensation intact.   Skin:    General: Skin is warm and dry.     Comments: Two superficial abrasions to the left forearm. One approx. 5 mm puncture wound to left forearm as well.   Neurological:     Mental Status: She is alert.      ED Treatments / Results  Labs (all labs ordered are listed, but only abnormal results are displayed) Labs Reviewed - No data to display  EKG None  Radiology No results  found.  Procedures Procedures (including critical care time)  Medications Ordered in ED Medications  rabies vaccine (RABAVERT) injection 1 mL (has no administration in time range)  rabies immune globulin (HYPERAB/KEDRAB) injection 2,250 Units (has no administration in time range)  ibuprofen (ADVIL) tablet 600 mg (has no administration in time range)     Initial Impression / Assessment and Plan / ED Course  I have reviewed the triage vital signs and the nursing notes.  Pertinent labs & imaging results that were available during my care of the patient were reviewed by me and considered in my medical decision making (see chart for details).        Francis DowseMegan Jamerson is a 23 y.o. female who presents to ED for cat bite just prior to arrival. Her tetanus is already up-to-date. Wounds quite superficial. Do not feel imaging is warranted at this time. Rabies vaccines given. Will prophylactically treat with Augmentin.  Home wound care instructions, follow-up.  Return precautions were discussed and all questions were answered.  Final Clinical Impressions(s) / ED Diagnoses   Final diagnoses:  Cat bite, initial encounter    ED Discharge Orders         Ordered    amoxicillin-clavulanate (AUGMENTIN) 875-125 MG tablet  Every 12 hours     06/12/19 1809           Katlin Ciszewski, Chase PicketJaime Pilcher, PA-C 06/12/19 1816    Alvira MondaySchlossman, Erin, MD 06/14/19 1714

## 2019-09-03 ENCOUNTER — Emergency Department (HOSPITAL_BASED_OUTPATIENT_CLINIC_OR_DEPARTMENT_OTHER)
Admission: EM | Admit: 2019-09-03 | Discharge: 2019-09-03 | Disposition: A | Discharge status: Home / Self Care | Payer: Medicaid Other | Attending: Emergency Medicine | Admitting: Emergency Medicine

## 2019-09-03 ENCOUNTER — Other Ambulatory Visit: Payer: Self-pay

## 2019-09-03 ENCOUNTER — Encounter (HOSPITAL_BASED_OUTPATIENT_CLINIC_OR_DEPARTMENT_OTHER): Payer: Self-pay | Admitting: *Deleted

## 2019-09-03 DIAGNOSIS — Z79899 Other long term (current) drug therapy: Secondary | ICD-10-CM | POA: Insufficient documentation

## 2019-09-03 DIAGNOSIS — B354 Tinea corporis: Secondary | ICD-10-CM | POA: Insufficient documentation

## 2019-09-03 DIAGNOSIS — F17210 Nicotine dependence, cigarettes, uncomplicated: Secondary | ICD-10-CM | POA: Insufficient documentation

## 2019-09-03 MED ORDER — TERBINAFINE HCL 250 MG PO TABS: 250.0000 mg | ORAL_TABLET | Freq: Every day | ORAL | 0 refills | Status: AC

## 2019-09-03 NOTE — ED Triage Notes (Signed)
Pt c/o rash to thigh x 1 month
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
## 2019-09-03 NOTE — ED Notes (Signed)
[place] and has noticed nickel sized round rashes on different parts of body that itch severely. Also works in grooming. NAD. A&Ox4.

## 2019-09-03 NOTE — Discharge Instructions (Addendum)
°  1. Medications: OTC Benadryl every 6 hours as needed for itching. Prescription sent to your pharmacy for terbinafine tablets.  This is used to treat ringworm when creams and ointments have failed.  2. Treatment: rest, drink plenty of fluids,   3. Follow Up: Please followup with your primary doctor ior dermatologist in 2-5 days for discussion of your diagnoses and further evaluation after today's visit; Please also use the resource guide to find a dermatologist.  If you do not have a primary care doctor use the resource guide provided to find one;   Please return to the ER for worsening rash, high fevers, difficulty breathing or other concerns.   [facility] Dermatologists:  [facility]  [place] [id number]  [phone]   Dr. Michelene Gardener, MD  [place]  [phone]  [facility]  [place]  [phone]   [place]  [phone]  Lavonna Monarch MD  [place]  [phone]  Katrina Stack  [place]  [phone]  [place] Amy Y MD  [address]  [phone]  [place]  [address]  [phone]

## 2020-02-05 ENCOUNTER — Emergency Department (HOSPITAL_BASED_OUTPATIENT_CLINIC_OR_DEPARTMENT_OTHER)
Admission: EM | Admit: 2020-02-05 | Discharge: 2020-02-05 | Disposition: A | Discharge status: Home / Self Care | Payer: Worker's Compensation | Attending: Emergency Medicine | Admitting: Emergency Medicine

## 2020-02-05 ENCOUNTER — Encounter (HOSPITAL_BASED_OUTPATIENT_CLINIC_OR_DEPARTMENT_OTHER): Payer: Self-pay | Admitting: Emergency Medicine

## 2020-02-05 ENCOUNTER — Other Ambulatory Visit: Payer: Self-pay

## 2020-02-05 ENCOUNTER — Emergency Department (HOSPITAL_BASED_OUTPATIENT_CLINIC_OR_DEPARTMENT_OTHER): Payer: Worker's Compensation

## 2020-02-05 DIAGNOSIS — S01112A Laceration without foreign body of left eyelid and periocular area, initial encounter: Secondary | ICD-10-CM | POA: Insufficient documentation

## 2020-02-05 DIAGNOSIS — S0990XA Unspecified injury of head, initial encounter: Secondary | ICD-10-CM | POA: Diagnosis not present

## 2020-02-05 DIAGNOSIS — Z23 Encounter for immunization: Secondary | ICD-10-CM | POA: Diagnosis not present

## 2020-02-05 DIAGNOSIS — F17210 Nicotine dependence, cigarettes, uncomplicated: Secondary | ICD-10-CM | POA: Insufficient documentation

## 2020-02-05 DIAGNOSIS — Y93K3 Activity, grooming and shearing an animal: Secondary | ICD-10-CM | POA: Insufficient documentation

## 2020-02-05 DIAGNOSIS — S01511A Laceration without foreign body of lip, initial encounter: Secondary | ICD-10-CM | POA: Insufficient documentation

## 2020-02-05 DIAGNOSIS — Y929 Unspecified place or not applicable: Secondary | ICD-10-CM | POA: Diagnosis not present

## 2020-02-05 DIAGNOSIS — Y990 Civilian activity done for income or pay: Secondary | ICD-10-CM | POA: Insufficient documentation

## 2020-02-05 DIAGNOSIS — S0181XA Laceration without foreign body of other part of head, initial encounter: Secondary | ICD-10-CM

## 2020-02-05 MED ORDER — LIDOCAINE-EPINEPHRINE (PF) 2 %-1:200000 IJ SOLN
10.0000 mL | Freq: Once | INTRAMUSCULAR | Status: AC
  Administered 2020-02-05: 10 mL via INTRADERMAL
  Filled 2020-02-05: qty 10

## 2020-02-05 MED ORDER — ACETAMINOPHEN 325 MG PO TABS
650.0000 mg | ORAL_TABLET | Freq: Once | ORAL | Status: AC
  Administered 2020-02-05: 20:00:00 650 mg via ORAL
  Filled 2020-02-05: qty 2

## 2020-02-05 NOTE — Discharge Instructions (Addendum)
Your Steri-Strips should fall off in the next 7 to 10 days.  I have apply absorbable sutures to your mouth, these will not be to be removed and likely will fall off in the next 7 to 10 days.
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
## 2021-09-08 DIAGNOSIS — Z0000 Encounter for general adult medical examination without abnormal findings: Secondary | ICD-10-CM | POA: Diagnosis not present

## 2021-09-08 DIAGNOSIS — R7303 Prediabetes: Secondary | ICD-10-CM | POA: Diagnosis not present

## 2021-09-08 DIAGNOSIS — R002 Palpitations: Secondary | ICD-10-CM | POA: Diagnosis not present

## 2021-09-08 DIAGNOSIS — Z202 Contact with and (suspected) exposure to infections with a predominantly sexual mode of transmission: Secondary | ICD-10-CM | POA: Diagnosis not present

## 2021-09-08 DIAGNOSIS — R300 Dysuria: Secondary | ICD-10-CM | POA: Diagnosis not present

## 2021-09-08 DIAGNOSIS — E669 Obesity, unspecified: Secondary | ICD-10-CM | POA: Diagnosis not present

## 2021-09-08 DIAGNOSIS — N926 Irregular menstruation, unspecified: Secondary | ICD-10-CM | POA: Diagnosis not present

## 2021-09-19 DIAGNOSIS — N926 Irregular menstruation, unspecified: Secondary | ICD-10-CM | POA: Diagnosis not present

## 2021-09-19 DIAGNOSIS — R3989 Other symptoms and signs involving the genitourinary system: Secondary | ICD-10-CM | POA: Diagnosis not present

## 2021-09-19 DIAGNOSIS — Z01419 Encounter for gynecological examination (general) (routine) without abnormal findings: Secondary | ICD-10-CM | POA: Diagnosis not present

## 2021-09-19 DIAGNOSIS — Z113 Encounter for screening for infections with a predominantly sexual mode of transmission: Secondary | ICD-10-CM | POA: Diagnosis not present

## 2021-09-19 DIAGNOSIS — N898 Other specified noninflammatory disorders of vagina: Secondary | ICD-10-CM | POA: Diagnosis not present

## 2021-09-20 DIAGNOSIS — F411 Generalized anxiety disorder: Secondary | ICD-10-CM | POA: Diagnosis not present

## 2021-09-27 ENCOUNTER — Ambulatory Visit: Payer: Medicaid Other | Admitting: Family Medicine

## 2021-09-27 DIAGNOSIS — F411 Generalized anxiety disorder: Secondary | ICD-10-CM | POA: Diagnosis not present

## 2021-10-05 DIAGNOSIS — F411 Generalized anxiety disorder: Secondary | ICD-10-CM | POA: Diagnosis not present

## 2021-10-06 DIAGNOSIS — H60501 Unspecified acute noninfective otitis externa, right ear: Secondary | ICD-10-CM | POA: Diagnosis not present

## 2021-10-06 DIAGNOSIS — F332 Major depressive disorder, recurrent severe without psychotic features: Secondary | ICD-10-CM | POA: Diagnosis not present

## 2021-10-06 DIAGNOSIS — F411 Generalized anxiety disorder: Secondary | ICD-10-CM | POA: Diagnosis not present

## 2021-10-13 DIAGNOSIS — N926 Irregular menstruation, unspecified: Secondary | ICD-10-CM | POA: Diagnosis not present

## 2021-10-31 DIAGNOSIS — R509 Fever, unspecified: Secondary | ICD-10-CM | POA: Diagnosis not present

## 2021-10-31 DIAGNOSIS — J069 Acute upper respiratory infection, unspecified: Secondary | ICD-10-CM | POA: Diagnosis not present

## 2021-10-31 DIAGNOSIS — F332 Major depressive disorder, recurrent severe without psychotic features: Secondary | ICD-10-CM | POA: Diagnosis not present

## 2021-10-31 DIAGNOSIS — Z03818 Encounter for observation for suspected exposure to other biological agents ruled out: Secondary | ICD-10-CM | POA: Diagnosis not present

## 2021-11-01 DIAGNOSIS — F411 Generalized anxiety disorder: Secondary | ICD-10-CM | POA: Diagnosis not present

## 2021-11-14 DIAGNOSIS — F411 Generalized anxiety disorder: Secondary | ICD-10-CM | POA: Diagnosis not present

## 2021-11-29 ENCOUNTER — Other Ambulatory Visit: Payer: Self-pay | Admitting: Internal Medicine

## 2021-11-29 ENCOUNTER — Ambulatory Visit
Admission: RE | Admit: 2021-11-29 | Discharge: 2021-11-29 | Disposition: A | Discharge status: Home / Self Care | Payer: BC Managed Care – PPO | Source: Ambulatory Visit | Attending: Internal Medicine | Admitting: Internal Medicine

## 2021-11-29 DIAGNOSIS — M25541 Pain in joints of right hand: Secondary | ICD-10-CM

## 2021-11-29 DIAGNOSIS — S6991XA Unspecified injury of right wrist, hand and finger(s), initial encounter: Secondary | ICD-10-CM | POA: Diagnosis not present

## 2021-11-29 DIAGNOSIS — M79644 Pain in right finger(s): Secondary | ICD-10-CM | POA: Diagnosis not present

## 2021-12-13 DIAGNOSIS — F411 Generalized anxiety disorder: Secondary | ICD-10-CM | POA: Diagnosis not present
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
## 2022-09-04 DIAGNOSIS — Z7251 High risk heterosexual behavior: Secondary | ICD-10-CM | POA: Diagnosis not present

## 2022-09-04 DIAGNOSIS — K582 Mixed irritable bowel syndrome: Secondary | ICD-10-CM | POA: Diagnosis not present

## 2022-09-04 DIAGNOSIS — Z9189 Other specified personal risk factors, not elsewhere classified: Secondary | ICD-10-CM | POA: Diagnosis not present

## 2022-09-04 DIAGNOSIS — R300 Dysuria: Secondary | ICD-10-CM | POA: Diagnosis not present

## 2022-09-04 DIAGNOSIS — N898 Other specified noninflammatory disorders of vagina: Secondary | ICD-10-CM | POA: Diagnosis not present

## 2022-09-26 DIAGNOSIS — N898 Other specified noninflammatory disorders of vagina: Secondary | ICD-10-CM | POA: Diagnosis not present

## 2022-09-26 DIAGNOSIS — R102 Pelvic and perineal pain: Secondary | ICD-10-CM | POA: Diagnosis not present

## 2022-09-26 DIAGNOSIS — Z01419 Encounter for gynecological examination (general) (routine) without abnormal findings: Secondary | ICD-10-CM | POA: Diagnosis not present

## 2022-11-06 DIAGNOSIS — R102 Pelvic and perineal pain: Secondary | ICD-10-CM | POA: Diagnosis not present

## 2022-11-06 DIAGNOSIS — N920 Excessive and frequent menstruation with regular cycle: Secondary | ICD-10-CM | POA: Diagnosis not present

## 2022-11-22 DIAGNOSIS — K219 Gastro-esophageal reflux disease without esophagitis: Secondary | ICD-10-CM | POA: Diagnosis not present

## 2022-11-22 DIAGNOSIS — K625 Hemorrhage of anus and rectum: Secondary | ICD-10-CM | POA: Diagnosis not present

## 2022-11-22 DIAGNOSIS — R195 Other fecal abnormalities: Secondary | ICD-10-CM | POA: Diagnosis not present

## 2022-12-21 DIAGNOSIS — N838 Other noninflammatory disorders of ovary, fallopian tube and broad ligament: Secondary | ICD-10-CM | POA: Diagnosis not present

## 2022-12-21 DIAGNOSIS — N939 Abnormal uterine and vaginal bleeding, unspecified: Secondary | ICD-10-CM | POA: Diagnosis not present

## 2022-12-21 DIAGNOSIS — Z3041 Encounter for surveillance of contraceptive pills: Secondary | ICD-10-CM | POA: Diagnosis not present

## 2022-12-22 DIAGNOSIS — N939 Abnormal uterine and vaginal bleeding, unspecified: Secondary | ICD-10-CM | POA: Diagnosis not present

## 2022-12-22 DIAGNOSIS — Z79899 Other long term (current) drug therapy: Secondary | ICD-10-CM | POA: Diagnosis not present

## 2022-12-22 DIAGNOSIS — Z0000 Encounter for general adult medical examination without abnormal findings: Secondary | ICD-10-CM | POA: Diagnosis not present

## 2023-01-01 DIAGNOSIS — N949 Unspecified condition associated with female genital organs and menstrual cycle: Secondary | ICD-10-CM | POA: Diagnosis not present

## 2023-03-14 DIAGNOSIS — R102 Pelvic and perineal pain: Secondary | ICD-10-CM | POA: Diagnosis not present

## 2023-03-14 DIAGNOSIS — N838 Other noninflammatory disorders of ovary, fallopian tube and broad ligament: Secondary | ICD-10-CM | POA: Diagnosis not present

## 2023-03-14 DIAGNOSIS — N920 Excessive and frequent menstruation with regular cycle: Secondary | ICD-10-CM | POA: Diagnosis not present

## 2023-03-14 DIAGNOSIS — N83201 Unspecified ovarian cyst, right side: Secondary | ICD-10-CM | POA: Diagnosis not present

## 2023-03-21 DIAGNOSIS — R102 Pelvic and perineal pain: Secondary | ICD-10-CM | POA: Diagnosis not present

## 2023-03-21 DIAGNOSIS — N83201 Unspecified ovarian cyst, right side: Secondary | ICD-10-CM | POA: Diagnosis not present

## 2023-04-05 DIAGNOSIS — L738 Other specified follicular disorders: Secondary | ICD-10-CM | POA: Diagnosis not present

## 2023-04-05 DIAGNOSIS — L814 Other melanin hyperpigmentation: Secondary | ICD-10-CM | POA: Diagnosis not present

## 2023-10-03 ENCOUNTER — Other Ambulatory Visit: Payer: Self-pay | Admitting: Nurse Practitioner

## 2023-10-03 DIAGNOSIS — N979 Female infertility, unspecified: Secondary | ICD-10-CM

## 2024-07-02 ENCOUNTER — Ambulatory Visit: Admitting: Dermatology

## 2024-07-02 ENCOUNTER — Encounter: Payer: Self-pay | Admitting: Dermatology

## 2024-07-02 VITALS — BP 132/84 | HR 113

## 2024-07-02 DIAGNOSIS — D485 Neoplasm of uncertain behavior of skin: Secondary | ICD-10-CM | POA: Diagnosis not present

## 2024-07-02 DIAGNOSIS — W908XXA Exposure to other nonionizing radiation, initial encounter: Secondary | ICD-10-CM | POA: Diagnosis not present

## 2024-07-02 DIAGNOSIS — L568 Other specified acute skin changes due to ultraviolet radiation: Secondary | ICD-10-CM | POA: Diagnosis not present

## 2024-07-02 DIAGNOSIS — L239 Allergic contact dermatitis, unspecified cause: Secondary | ICD-10-CM | POA: Diagnosis not present

## 2024-07-02 DIAGNOSIS — D2271 Melanocytic nevi of right lower limb, including hip: Secondary | ICD-10-CM | POA: Diagnosis not present

## 2024-07-02 DIAGNOSIS — L578 Other skin changes due to chronic exposure to nonionizing radiation: Secondary | ICD-10-CM

## 2024-07-02 NOTE — Patient Instructions (Addendum)

## 2024-07-02 NOTE — Progress Notes (Signed)
   New Patient Visit   Subjective  Jacqueline Beard is a 28 y.o. female who presents for the following: Spot Check  Patient states she has spot located at the right upper leg that she would like to have examined. Patient reports the spot has been there for several year but recently changed in the past 2 months. She reports the areas are not bothersome. Patient reports she has not previously been treated for these areas. Patient denies Hx of bx. Patient denies family history of skin cancer(s).  The patient has spots, moles and lesions to be evaluated, some may be new or changing and the patient may have concern these could be cancer.  The following portions of the chart were reviewed this encounter and updated as appropriate: medications, allergies, medical history  Review of Systems:  No other skin or systemic complaints except as noted in HPI or Assessment and Plan.  Objective  Well appearing patient in no apparent distress; mood and affect are within normal limits.  A focused examination was performed of the following areas: Right upper leg  Relevant exam findings are noted in the Assessment and Plan.       Right Thigh - Anterior 7 mm violaceous scaly papule  Assessment & Plan    ALLERGIC CONTACT DERMATITIS Exam: linear scaly pink papules and/or plaques with scale  Secondary to poison ivy or poison oak exposure  Treatment Plan: Was previously given prednisone other provide Lesions are healing and will continue to fade with time  ACTINIC DAMAGE and RECENT SUNBURN - chronic, secondary to cumulative UV radiation exposure/sun exposure over time - diffuse scaly erythematous macules with underlying dyspigmentation - Recommend daily broad spectrum sunscreen SPF 30+ to sun-exposed areas, reapply every 2 hours as needed.  - Recommend staying in the shade or wearing long sleeves, sun glasses (UVA+UVB protection) and wide brim hats (4-inch brim around the entire circumference of the  hat). - Call for new or changing lesions.  NEOPLASM OF UNCERTAIN BEHAVIOR OF SKIN Right Thigh - Anterior Epidermal / dermal shaving  Lesion diameter (cm):  0.7 Informed consent: discussed and consent obtained   Timeout: patient name, date of birth, surgical site, and procedure verified   Procedure prep:  Patient was prepped and draped in usual sterile fashion Prep type:  Isopropyl alcohol Anesthesia: the lesion was anesthetized in a standard fashion   Anesthetic:  1% lidocaine  w/ epinephrine  1-100,000 buffered w/ 8.4% NaHCO3 Instrument used: DermaBlade   Hemostasis achieved with: aluminum chloride   Outcome: patient tolerated procedure well   Post-procedure details: wound care instructions given    Specimen A - Surgical pathology Differential Diagnosis: R/O DF vs Nevus vs other  Check Margins: No ACTINIC SKIN DAMAGE   ALLERGIC CONTACT DERMATITIS, UNSPECIFIED TRIGGER    Return if symptoms worsen or fail to improve.  I, Jetta Ager, am acting as scribe for RUFUS CHRISTELLA HOLY, MD.  Documentation: I have reviewed the above documentation for accuracy and completeness, and I agree with the above.  RUFUS CHRISTELLA HOLY, MD

## 2024-07-07 ENCOUNTER — Ambulatory Visit: Payer: Self-pay | Admitting: Dermatology

## 2024-07-07 LAB — SURGICAL PATHOLOGY

## 2024-10-06 ENCOUNTER — Encounter: Payer: Self-pay | Admitting: Physician Assistant

## 2024-10-06 ENCOUNTER — Ambulatory Visit: Admitting: Physician Assistant

## 2024-10-06 VITALS — BP 103/76

## 2024-10-06 DIAGNOSIS — L578 Other skin changes due to chronic exposure to nonionizing radiation: Secondary | ICD-10-CM | POA: Diagnosis not present

## 2024-10-06 DIAGNOSIS — L814 Other melanin hyperpigmentation: Secondary | ICD-10-CM | POA: Diagnosis not present

## 2024-10-06 DIAGNOSIS — W908XXA Exposure to other nonionizing radiation, initial encounter: Secondary | ICD-10-CM

## 2024-10-06 DIAGNOSIS — Z1283 Encounter for screening for malignant neoplasm of skin: Secondary | ICD-10-CM | POA: Diagnosis not present

## 2024-10-06 DIAGNOSIS — D229 Melanocytic nevi, unspecified: Secondary | ICD-10-CM

## 2024-10-06 DIAGNOSIS — L700 Acne vulgaris: Secondary | ICD-10-CM

## 2024-10-06 DIAGNOSIS — L732 Hidradenitis suppurativa: Secondary | ICD-10-CM

## 2024-10-06 DIAGNOSIS — D1801 Hemangioma of skin and subcutaneous tissue: Secondary | ICD-10-CM

## 2024-10-06 NOTE — Progress Notes (Signed)
   Follow-Up Visit   Subjective  Jacqueline Beard is a 28 y.o. female ESTABLISHED PATIENT who presents for the following: Skin Cancer Screening and Full Body Skin Exam - No history of skin cancer, no family history of melanoma. She has some spots of her inner thighs that she would like checked.  The patient presents for Total-Body Skin Exam (TBSE) for skin cancer screening and mole check. The patient has spots, moles and lesions to be evaluated, some may be new or changing and the patient may have concern these could be cancer.    The following portions of the chart were reviewed this encounter and updated as appropriate: medications, allergies, medical history  Review of Systems:  No other skin or systemic complaints except as noted in HPI or Assessment and Plan.  Objective  Well appearing patient in no apparent distress; mood and affect are within normal limits.  A full examination was performed including scalp, head, eyes, ears, nose, lips, neck, chest, axillae, abdomen, back, buttocks, bilateral upper extremities, bilateral lower extremities, hands, feet, fingers, toes, fingernails, and toenails. All findings within normal limits unless otherwise noted below.   Relevant physical exam findings are noted in the Assessment and Plan.    Assessment & Plan   SKIN CANCER SCREENING PERFORMED TODAY.  ACTINIC DAMAGE - Chronic condition, secondary to cumulative UV/sun exposure - diffuse scaly erythematous macules with underlying dyspigmentation - Recommend daily broad spectrum sunscreen SPF 30+ to sun-exposed areas, reapply every 2 hours as needed.  - Staying in the shade or wearing long sleeves, sun glasses (UVA+UVB protection) and wide brim hats (4-inch brim around the entire circumference of the hat) are also recommended for sun protection.  - Call for new or changing lesions.  LENTIGINES, HEMANGIOMAS - Benign normal skin lesions - Benign-appearing - Call for any changes  MELANOCYTIC  NEVI - Tan-brown and/or pink-flesh-colored symmetric macules and papules - Benign appearing on exam today - Observation - Call clinic for new or changing moles - Recommend daily use of broad spectrum spf 30+ sunscreen to sun-exposed areas.   HIDRADENITIS SUPPURATIVA Exam: Scars   Hidradenitis Suppurativa is a chronic; persistent; non-curable, but treatable condition due to abnormal inflamed sweat glands in the body folds (axilla, inframammary, groin, medial thighs), causing recurrent painful draining cysts and scarring. It can be associated with severe scarring acne and cysts; also abscesses and scarring of scalp. The goal is control and prevention of flares, as it is not curable. Scars are permanent and can be thickened. Treatment may include daily use of topical medication and oral antibiotics.  Oral isotretinoin may also be helpful.  For some cases, Humira or Cosentyx (biologic injections) may be prescribed to decrease the inflammatory process and prevent flares.  When indicated, inflamed cysts may also be treated surgically.  Treatment Plan: Recommend alternating Dial antibacterial soap, hibiclens and Panoxyl 10 wash  ACNE VULGARIS Exam: resolving papules   Treatment Plan: Recommend Panoxyl 10 wash      SCREENING EXAM FOR SKIN CANCER   LENTIGINES   MULTIPLE BENIGN NEVI   CHERRY ANGIOMA   ACTINIC SKIN DAMAGE   HIDRADENITIS SUPPURATIVA   ACNE VULGARIS   Return if symptoms worsen or fail to improve.  I, Roseline Hutchinson, CMA, am acting as scribe for Norvell Ureste K, PA-C .   Documentation: I have reviewed the above documentation for accuracy and completeness, and I agree with the above.  Raigen Jagielski K, PA-C

## 2024-10-06 NOTE — Patient Instructions (Addendum)

## 2024-10-07 NOTE — Addendum Note (Signed)
 Addended by: ORMAN AMERICA on: 10/07/2024 01:46 PM   Modules accepted: Level of Service

## 2024-10-30 ENCOUNTER — Other Ambulatory Visit: Payer: Self-pay | Admitting: Nurse Practitioner

## 2024-10-30 DIAGNOSIS — N946 Dysmenorrhea, unspecified: Secondary | ICD-10-CM

## 2024-11-07 ENCOUNTER — Inpatient Hospital Stay
Admission: RE | Admit: 2024-11-07 | Discharge: 2024-11-07 | Attending: Nurse Practitioner | Admitting: Nurse Practitioner

## 2024-11-07 DIAGNOSIS — N946 Dysmenorrhea, unspecified: Secondary | ICD-10-CM
